# Patient Record
Sex: Female | Born: 1945
Health system: Southern US, Community
[De-identification: ages and names within clinical notes are randomized; demographics above are authoritative.]

## PROBLEM LIST (undated history)

## (undated) DIAGNOSIS — E669 Obesity, unspecified: Secondary | ICD-10-CM

## (undated) DIAGNOSIS — M51369 Other intervertebral disc degeneration, lumbar region without mention of lumbar back pain or lower extremity pain: Secondary | ICD-10-CM

## (undated) DIAGNOSIS — M5136 Other intervertebral disc degeneration, lumbar region: Secondary | ICD-10-CM

## (undated) DIAGNOSIS — E785 Hyperlipidemia, unspecified: Secondary | ICD-10-CM

## (undated) DIAGNOSIS — F32A Depression, unspecified: Secondary | ICD-10-CM

## (undated) DIAGNOSIS — E119 Type 2 diabetes mellitus without complications: Secondary | ICD-10-CM

## (undated) DIAGNOSIS — F329 Major depressive disorder, single episode, unspecified: Secondary | ICD-10-CM

## (undated) HISTORY — DX: Major depressive disorder, single episode, unspecified: F32.9

## (undated) HISTORY — DX: Obesity, unspecified: E66.9

## (undated) HISTORY — PX: CHOLECYSTECTOMY: SHX55

## (undated) HISTORY — PX: OTHER SURGICAL HISTORY: SHX169

## (undated) HISTORY — DX: Other intervertebral disc degeneration, lumbar region without mention of lumbar back pain or lower extremity pain: M51.369

## (undated) HISTORY — PX: BACK SURGERY: SHX140

## (undated) HISTORY — DX: Other intervertebral disc degeneration, lumbar region: M51.36

## (undated) HISTORY — DX: Depression, unspecified: F32.A

## (undated) HISTORY — DX: Hyperlipidemia, unspecified: E78.5

---

## 2008-10-18 ENCOUNTER — Encounter: Admission: RE | Admit: 2008-10-18 | Discharge: 2008-10-18 | Payer: Self-pay

## 2008-10-18 ENCOUNTER — Encounter: Admission: RE | Admit: 2008-10-18 | Discharge: 2008-10-18 | Payer: Self-pay | Admitting: Family Medicine

## 2008-10-29 ENCOUNTER — Encounter: Admission: RE | Admit: 2008-10-29 | Discharge: 2008-10-29 | Payer: Self-pay | Admitting: Family Medicine

## 2009-01-20 ENCOUNTER — Encounter: Admission: RE | Admit: 2009-01-20 | Discharge: 2009-01-20 | Payer: Self-pay | Admitting: Internal Medicine

## 2009-01-24 ENCOUNTER — Encounter: Admission: RE | Admit: 2009-01-24 | Discharge: 2009-01-24 | Payer: Self-pay | Admitting: Internal Medicine

## 2009-01-24 LAB — HM MAMMOGRAPHY: HM Mammogram: NORMAL

## 2009-02-04 LAB — HM DEXA SCAN: HM Dexa Scan: NORMAL

## 2009-11-27 ENCOUNTER — Ambulatory Visit: Payer: Self-pay | Admitting: Family Medicine

## 2009-12-11 ENCOUNTER — Ambulatory Visit: Payer: Self-pay | Admitting: Family Medicine

## 2010-02-01 ENCOUNTER — Encounter: Payer: Self-pay | Admitting: Internal Medicine

## 2010-02-10 ENCOUNTER — Ambulatory Visit
Admission: RE | Admit: 2010-02-10 | Discharge: 2010-02-10 | Payer: Self-pay | Source: Home / Self Care | Attending: Family Medicine | Admitting: Family Medicine

## 2010-04-27 ENCOUNTER — Ambulatory Visit: Payer: Self-pay | Admitting: Family Medicine

## 2010-07-23 ENCOUNTER — Encounter: Payer: Self-pay | Admitting: Family Medicine

## 2012-01-29 ENCOUNTER — Inpatient Hospital Stay (HOSPITAL_COMMUNITY)
Admission: EM | Admit: 2012-01-29 | Discharge: 2012-02-01 | DRG: 372 | Disposition: A | Discharge status: Home / Self Care | Payer: Medicare Other | Attending: Internal Medicine | Admitting: Internal Medicine

## 2012-01-29 ENCOUNTER — Encounter (HOSPITAL_COMMUNITY): Payer: Self-pay | Admitting: Emergency Medicine

## 2012-01-29 ENCOUNTER — Emergency Department (HOSPITAL_COMMUNITY): Payer: Medicare Other

## 2012-01-29 ENCOUNTER — Emergency Department (HOSPITAL_COMMUNITY)
Admission: EM | Admit: 2012-01-29 | Discharge: 2012-01-29 | Disposition: A | Discharge status: Nursing Home (Includes ICF) | Payer: Medicare Other | Source: Home / Self Care | Attending: Emergency Medicine | Admitting: Emergency Medicine

## 2012-01-29 ENCOUNTER — Other Ambulatory Visit (HOSPITAL_COMMUNITY): Payer: Medicare Other

## 2012-01-29 ENCOUNTER — Encounter (HOSPITAL_COMMUNITY): Payer: Self-pay | Admitting: *Deleted

## 2012-01-29 DIAGNOSIS — Z79899 Other long term (current) drug therapy: Secondary | ICD-10-CM

## 2012-01-29 DIAGNOSIS — M51379 Other intervertebral disc degeneration, lumbosacral region without mention of lumbar back pain or lower extremity pain: Secondary | ICD-10-CM | POA: Diagnosis present

## 2012-01-29 DIAGNOSIS — T148XXA Other injury of unspecified body region, initial encounter: Secondary | ICD-10-CM | POA: Diagnosis present

## 2012-01-29 DIAGNOSIS — R188 Other ascites: Secondary | ICD-10-CM

## 2012-01-29 DIAGNOSIS — S301XXA Contusion of abdominal wall, initial encounter: Secondary | ICD-10-CM | POA: Diagnosis present

## 2012-01-29 DIAGNOSIS — Y92009 Unspecified place in unspecified non-institutional (private) residence as the place of occurrence of the external cause: Secondary | ICD-10-CM

## 2012-01-29 DIAGNOSIS — Z9181 History of falling: Secondary | ICD-10-CM

## 2012-01-29 DIAGNOSIS — Z7982 Long term (current) use of aspirin: Secondary | ICD-10-CM

## 2012-01-29 DIAGNOSIS — L089 Local infection of the skin and subcutaneous tissue, unspecified: Secondary | ICD-10-CM | POA: Diagnosis present

## 2012-01-29 DIAGNOSIS — M5137 Other intervertebral disc degeneration, lumbosacral region: Secondary | ICD-10-CM | POA: Diagnosis present

## 2012-01-29 DIAGNOSIS — Z6834 Body mass index (BMI) 34.0-34.9, adult: Secondary | ICD-10-CM

## 2012-01-29 DIAGNOSIS — Z23 Encounter for immunization: Secondary | ICD-10-CM

## 2012-01-29 DIAGNOSIS — E785 Hyperlipidemia, unspecified: Secondary | ICD-10-CM | POA: Diagnosis present

## 2012-01-29 DIAGNOSIS — S3981XA Other specified injuries of abdomen, initial encounter: Secondary | ICD-10-CM

## 2012-01-29 DIAGNOSIS — E669 Obesity, unspecified: Secondary | ICD-10-CM | POA: Diagnosis present

## 2012-01-29 DIAGNOSIS — K651 Peritoneal abscess: Principal | ICD-10-CM | POA: Diagnosis present

## 2012-01-29 DIAGNOSIS — Z9089 Acquired absence of other organs: Secondary | ICD-10-CM

## 2012-01-29 DIAGNOSIS — S3991XA Unspecified injury of abdomen, initial encounter: Secondary | ICD-10-CM

## 2012-01-29 DIAGNOSIS — R109 Unspecified abdominal pain: Secondary | ICD-10-CM

## 2012-01-29 DIAGNOSIS — W19XXXA Unspecified fall, initial encounter: Secondary | ICD-10-CM | POA: Diagnosis present

## 2012-01-29 DIAGNOSIS — E119 Type 2 diabetes mellitus without complications: Secondary | ICD-10-CM | POA: Diagnosis present

## 2012-01-29 HISTORY — DX: Type 2 diabetes mellitus without complications: E11.9

## 2012-01-29 LAB — CBC WITH DIFFERENTIAL/PLATELET
Basophils Absolute: 0 10*3/uL (ref 0.0–0.1)
Eosinophils Relative: 5 % (ref 0–5)
HCT: 34.9 % — ABNORMAL LOW (ref 36.0–46.0)
Hemoglobin: 11.4 g/dL — ABNORMAL LOW (ref 12.0–15.0)
Lymphocytes Relative: 22 % (ref 12–46)
Lymphs Abs: 2 10*3/uL (ref 0.7–4.0)
MCV: 79 fL (ref 78.0–100.0)
Monocytes Absolute: 0.7 10*3/uL (ref 0.1–1.0)
Neutro Abs: 5.8 10*3/uL (ref 1.7–7.7)
RBC: 4.42 MIL/uL (ref 3.87–5.11)
RDW: 14.5 % (ref 11.5–15.5)
WBC: 8.9 10*3/uL (ref 4.0–10.5)

## 2012-01-29 LAB — URINALYSIS, ROUTINE W REFLEX MICROSCOPIC
Bilirubin Urine: NEGATIVE
Hgb urine dipstick: NEGATIVE
Nitrite: NEGATIVE
Protein, ur: NEGATIVE mg/dL
Urobilinogen, UA: 0.2 mg/dL (ref 0.0–1.0)

## 2012-01-29 LAB — COMPREHENSIVE METABOLIC PANEL
ALT: 17 U/L (ref 0–35)
AST: 16 U/L (ref 0–37)
CO2: 22 mEq/L (ref 19–32)
Calcium: 9.3 mg/dL (ref 8.4–10.5)
Chloride: 101 mEq/L (ref 96–112)
Creatinine, Ser: 0.82 mg/dL (ref 0.50–1.10)
GFR calc Af Amer: 83 mL/min — ABNORMAL LOW (ref >90)
GFR calc non Af Amer: 72 mL/min — ABNORMAL LOW (ref >90)
Glucose, Bld: 89 mg/dL (ref 70–99)
Total Bilirubin: 0.1 mg/dL — ABNORMAL LOW (ref 0.3–1.2)

## 2012-01-29 LAB — POCT URINALYSIS DIP (DEVICE)
Bilirubin Urine: NEGATIVE
Glucose, UA: NEGATIVE mg/dL
Ketones, ur: NEGATIVE mg/dL
Leukocytes, UA: NEGATIVE
Specific Gravity, Urine: 1.015 (ref 1.005–1.030)

## 2012-01-29 MED ORDER — SODIUM CHLORIDE 0.9 % IV SOLN
INTRAVENOUS | Status: DC
  Administered 2012-01-29: 14:00:00 via INTRAVENOUS

## 2012-01-29 MED ORDER — ACETAMINOPHEN 650 MG RE SUPP: 650.0000 mg | Freq: Four times a day (QID) | RECTAL | Status: DC | PRN

## 2012-01-29 MED ORDER — ALUM & MAG HYDROXIDE-SIMETH 200-200-20 MG/5ML PO SUSP: 30.0000 mL | Freq: Four times a day (QID) | ORAL | Status: DC | PRN

## 2012-01-29 MED ORDER — ONDANSETRON HCL 4 MG PO TABS: 4.0000 mg | ORAL_TABLET | Freq: Four times a day (QID) | ORAL | Status: DC | PRN

## 2012-01-29 MED ORDER — ONDANSETRON HCL 4 MG/2ML IJ SOLN
4.0000 mg | Freq: Four times a day (QID) | INTRAMUSCULAR | Status: DC | PRN
  Administered 2012-01-30: 4 mg via INTRAVENOUS
  Filled 2012-01-29: qty 2

## 2012-01-29 MED ORDER — IOHEXOL 300 MG/ML  SOLN
100.0000 mL | Freq: Once | INTRAMUSCULAR | Status: AC | PRN
  Administered 2012-01-29: 100 mL via INTRAVENOUS

## 2012-01-29 MED ORDER — ALBUTEROL SULFATE (5 MG/ML) 0.5% IN NEBU: 2.5000 mg | INHALATION_SOLUTION | RESPIRATORY_TRACT | Status: DC | PRN

## 2012-01-29 MED ORDER — OXYCODONE HCL 5 MG PO TABS
5.0000 mg | ORAL_TABLET | ORAL | Status: DC | PRN
  Administered 2012-01-30: 5 mg via ORAL
  Filled 2012-01-29: qty 1

## 2012-01-29 MED ORDER — ACETAMINOPHEN 325 MG PO TABS
650.0000 mg | ORAL_TABLET | Freq: Four times a day (QID) | ORAL | Status: DC | PRN
  Administered 2012-01-30: 650 mg via ORAL
  Filled 2012-01-29 (×2): qty 2

## 2012-01-29 MED ORDER — SODIUM CHLORIDE 0.9 % IV SOLN
INTRAVENOUS | Status: DC
  Administered 2012-01-29: 1000 mL via INTRAVENOUS
  Administered 2012-01-31: 18:00:00 via INTRAVENOUS
  Administered 2012-01-31: 1000 mL via INTRAVENOUS
  Administered 2012-02-01: 06:00:00 via INTRAVENOUS

## 2012-01-29 MED ORDER — IOHEXOL 300 MG/ML  SOLN
50.0000 mL | Freq: Once | INTRAMUSCULAR | Status: AC | PRN
  Administered 2012-01-29: 50 mL via ORAL

## 2012-01-29 MED ORDER — POLYETHYLENE GLYCOL 3350 17 G PO PACK
17.0000 g | PACK | Freq: Every day | ORAL | Status: DC
  Filled 2012-01-29 (×3): qty 1

## 2012-01-29 MED ORDER — MORPHINE SULFATE 2 MG/ML IJ SOLN: 1.0000 mg | INTRAMUSCULAR | Status: DC | PRN

## 2012-01-29 NOTE — Consult Note (Signed)
Reason for Consult:abdominal pain Referring Physician: Dr. Jaynie Crumble is an 67 y.o. female.  HPI: the patient is a 67 year old white female who fell about a month ago. At that time she landed on a baby stroller on her right side. After this incident she was sore all over. As her soreness resolved she remained sore on her right mid abdomen. She denies any nausea or vomiting. Her appetite has been good and her bowels are working normally. She has experienced some chills and low-grade fever. She came to the emergency department and a CT scan revealed what appears to be a resolving hematoma of her right abdominal wall  Past Medical History  Diagnosis Date  . Obesity   . DDD (degenerative disc disease), lumbar   . Hyperlipidemia   . Diabetes mellitus without complication     diet controlled    Past Surgical History  Procedure Date  . Cholecystectomy   . Back surgery   . Colorectal surgery     No family history on file.  Social History:  reports that she has never smoked. She does not have any smokeless tobacco history on file. She reports that she does not drink alcohol or use illicit drugs.  Allergies:  Allergies  Allergen Reactions  . Food Other (See Comments)    Gluten causes extreme intestinal distress     Medications: I have reviewed the patient's current medications.  Results for orders placed during the hospital encounter of 01/29/12 (from the past 48 hour(s))  CBC WITH DIFFERENTIAL     Status: Abnormal   Collection Time   01/29/12  1:38 PM      Component Value Range Comment   WBC 8.9  4.0 - 10.5 K/uL    RBC 4.42  3.87 - 5.11 MIL/uL    Hemoglobin 11.4 (*) 12.0 - 15.0 g/dL    HCT 16.1 (*) 09.6 - 46.0 %    MCV 79.0  78.0 - 100.0 fL    MCH 25.8 (*) 26.0 - 34.0 pg    MCHC 32.7  30.0 - 36.0 g/dL    RDW 04.5  40.9 - 81.1 %    Platelets 413 (*) 150 - 400 K/uL    Neutrophils Relative 65  43 - 77 %    Neutro Abs 5.8  1.7 - 7.7 K/uL    Lymphocytes Relative 22  12 - 46 %     Lymphs Abs 2.0  0.7 - 4.0 K/uL    Monocytes Relative 7  3 - 12 %    Monocytes Absolute 0.7  0.1 - 1.0 K/uL    Eosinophils Relative 5  0 - 5 %    Eosinophils Absolute 0.5  0.0 - 0.7 K/uL    Basophils Relative 0  0 - 1 %    Basophils Absolute 0.0  0.0 - 0.1 K/uL   COMPREHENSIVE METABOLIC PANEL     Status: Abnormal   Collection Time   01/29/12  1:38 PM      Component Value Range Comment   Sodium 138  135 - 145 mEq/L    Potassium 3.5  3.5 - 5.1 mEq/L    Chloride 101  96 - 112 mEq/L    CO2 22  19 - 32 mEq/L    Glucose, Bld 89  70 - 99 mg/dL    BUN 16  6 - 23 mg/dL    Creatinine, Ser 9.14  0.50 - 1.10 mg/dL    Calcium 9.3  8.4 - 78.2 mg/dL  Total Protein 8.0  6.0 - 8.3 g/dL    Albumin 3.4 (*) 3.5 - 5.2 g/dL    AST 16  0 - 37 U/L    ALT 17  0 - 35 U/L    Alkaline Phosphatase 85  39 - 117 U/L    Total Bilirubin 0.1 (*) 0.3 - 1.2 mg/dL    GFR calc non Af Amer 72 (*) >90 mL/min    GFR calc Af Amer 83 (*) >90 mL/min   URINALYSIS, ROUTINE W REFLEX MICROSCOPIC     Status: Abnormal   Collection Time   01/29/12  2:47 PM      Component Value Range Comment   Color, Urine YELLOW  YELLOW    APPearance CLOUDY (*) CLEAR    Specific Gravity, Urine 1.010  1.005 - 1.030    pH 5.5  5.0 - 8.0    Glucose, UA NEGATIVE  NEGATIVE mg/dL    Hgb urine dipstick NEGATIVE  NEGATIVE    Bilirubin Urine NEGATIVE  NEGATIVE    Ketones, ur NEGATIVE  NEGATIVE mg/dL    Protein, ur NEGATIVE  NEGATIVE mg/dL    Urobilinogen, UA 0.2  0.0 - 1.0 mg/dL    Nitrite NEGATIVE  NEGATIVE    Leukocytes, UA NEGATIVE  NEGATIVE MICROSCOPIC NOT DONE ON URINES WITH NEGATIVE PROTEIN, BLOOD, LEUKOCYTES, NITRITE, OR GLUCOSE <1000 mg/dL.    Ct Abdomen Pelvis W Contrast  01/29/2012  *RADIOLOGY REPORT*  Clinical Data: Fall on December 14.  Persistent right mid abdominal pain.  History of "internal abscess in this location."  CT ABDOMEN AND PELVIS WITH CONTRAST  Technique:  Multidetector CT imaging of the abdomen and pelvis was  performed following the standard protocol during bolus administration of intravenous contrast.  Contrast: OMNIPAQUE IOHEXOL 300 MG/ML  SOLN  Comparison: 10/19/2008  Findings: Lung bases:  Mild volume loss at the left lung base. Mild cardiomegaly, without pericardial or pleural effusion.  Abdomen/pelvis:  Normal liver, spleen, stomach.  Transverse duodenal diverticulum through which the common duct traverses.  No biliary ductal dilatation or pancreatic ductal dilatation. Cholecystectomy.  Normal adrenal glands and left kidney.  There is minimal right-sided caliectasis, indeterminate etiology.  Chronic. Favored to be within normal variation.  At the site of resolving fluid collection in 2010, under the anterior aspect right hemidiaphragm extending along the right perihepatic space, is a peripherally enhancing or thick-walled fluid collection which measures 8.6 x 6.9 cm image 39/series 2.  On coronal images, this measures 8.0 cm.  There is mild surrounding edema.  No gas within.  No communication with the adjacent colon.  No retroperitoneal or retrocrural adenopathy.  Normal colon and terminal ileum.  Appendix likely identified on image 60.  No right lower quadrant inflammation.  Normal small bowel.  No pelvic adenopathy.  Tiny focus of air within the nondependent urinary bladder.  Suspect a sub centimeter uterine body fibroid on image 75/series 2. No adnexal mass or significant free fluid.  Bones/Musculoskeletal:  Advanced degenerative disc disease at the lumbosacral junction.  IMPRESSION: 1.  Development of a complex fluid collection in the right upper quadrant, at the site of a resolving collection back 2010.  This is suspicious for recurrent infection versus chronic hematoma. 2.  No other acute process in the abdomen or pelvis. 3.  Air within the urinary bladder, possibly iatrogenic. Clinically correlate.   Original Report Authenticated By: Jeronimo Greaves, M.D.     Review of Systems  Constitutional: Positive  for fever and chills.  HENT:  Negative.   Eyes: Negative.   Respiratory: Negative.   Cardiovascular: Negative.   Gastrointestinal: Positive for abdominal pain.  Genitourinary: Negative.   Musculoskeletal: Negative.   Skin: Negative.   Neurological: Negative.   Endo/Heme/Allergies: Negative.   Psychiatric/Behavioral: Negative.    Blood pressure 121/56, pulse 96, temperature 98.5 F (36.9 C), temperature source Oral, resp. rate 16, height 5\' 3"  (1.6 m), weight 194 lb (87.998 kg), SpO2 100.00%. Physical Exam  Constitutional: She is oriented to person, place, and time. She appears well-developed and well-nourished.  HENT:  Head: Normocephalic and atraumatic.  Eyes: Conjunctivae normal and EOM are normal. Pupils are equal, round, and reactive to light.  Neck: Normal range of motion. Neck supple.  Cardiovascular: Normal rate, regular rhythm and normal heart sounds.   Respiratory: Effort normal and breath sounds normal.  GI: Soft. Bowel sounds are normal.       There is mild tenderness and a palpable mass in the right midabdomen. No guarding or peritonitis. The rest of the abdomen is soft.  Musculoskeletal: Normal range of motion.  Neurological: She is alert and oriented to person, place, and time.  Skin: Skin is warm and dry.  Psychiatric: She has a normal mood and affect. Her behavior is normal.    Assessment/Plan: The patient has a history of a fall one month ago where she probably sustained some bleeding into her right abdominal wall. A CT scan shows a fluid collection at this location which is probably old liquefied hematoma. I think this could be percutaneously aspirated via interventional radiology and cultured. There is no indication for surgery at this time.  TOTH III,Jamil Armwood S 01/29/2012, 7:24 PM

## 2012-01-29 NOTE — ED Notes (Signed)
Pt states she has hx of internal abscess in the same location as where she is having pain in her right mid abdomen. Pt states abscess was not removed, she was on abx for 4 months for the abscess.

## 2012-01-29 NOTE — H&P (Addendum)
PATIENT DETAILS Name: Stephanie Coleman Age: 67 y.o. Sex: female Date of Birth: 1945/03/20 Admit Date: 01/29/2012 PCP:No primary provider on file.   CHIEF COMPLAINT:  Persistent right upper quadrant pain since a fall on 12/25/11  HPI: Patient is a 67 year old Caucasian female with a history of diet-controlled diabetes, who unfortunately sustained a fall on 12/25/11 while walking in the dark and missed a step. She fell forward and fell on her face, she sustained numerous bruising in her bilateral shoulder area, right knee, left thumb area. She then started having aches and pains all over her body from this. She did not seek any medical attention, and applied hot and cold packs and took over-the-counter medications for pain. Her pain in the other areas completely went away, however she then started to have persisting pain in the right upper quadrant area. She initially tried to manage this with NSAIDs and hot and cold packs. However for the past 10 days or so, pain has been unrelenting and 10/10 at times. She at times claims to felt slightly feverish, but there is no documented history of fever. She has been persistently afebrile here in the emergency room. Because of persistent pain, she presented to the urgent care and was then transferred to the ED for further evaluation. A CT scan of the abdomen done here in the emergency room shows a complex fluid collection in the right upper quadrant. She in 2010had intra-abdominal abscess his, that was treated with numerous antibiotics for 4 months by Dr. Donell Beers. Since a CT scan of the abdomen was read as either a chronic hematoma or recurrent infection, the ED physician spoke with surgery on call who subsequently recommended medically admission and interventional radiology consult for drainage of the hematoma/abscess. - She denies any headache, shortness of breath, chest pain, nausea vomiting or diarrhea. She denies any dysuria.   ALLERGIES:   Allergies  Allergen  Reactions  . Food Other (See Comments)    Gluten causes extreme intestinal distress     PAST MEDICAL HISTORY: Past Medical History  Diagnosis Date  . Obesity   . DDD (degenerative disc disease), lumbar   . Hyperlipidemia   . Diabetes mellitus without complication     diet controlled    PAST SURGICAL HISTORY: Past Surgical History  Procedure Date  . Cholecystectomy   . Back surgery   . Colorectal surgery     MEDICATIONS AT HOME: Prior to Admission medications   Medication Sig Start Date End Date Taking? Authorizing Provider  Aspirin-Caffeine (BAYER BACK & BODY PAIN EX ST) 500-32.5 MG TABS Take 2 tablets by mouth 2 (two) times daily as needed. For pain   Yes Historical Provider, MD  estradiol (ESTRACE) 0.5 MG tablet Take 0.5 mg by mouth daily.   Yes Historical Provider, MD  medroxyPROGESTERone (PROVERA) 2.5 MG tablet Take 2.5 mg by mouth daily.   Yes Historical Provider, MD    FAMILY HISTORY: No family history on file.  SOCIAL HISTORY:  reports that she has never smoked. She does not have any smokeless tobacco history on file. She reports that she does not drink alcohol or use illicit drugs.  REVIEW OF SYSTEMS:  Constitutional:   No  weight loss, night sweats,  Fevers, chills, fatigue.  HEENT:    No headaches, Difficulty swallowing,Tooth/dental problems,Sore throat,  No sneezing, itching, ear ache, nasal congestion, post nasal drip,   Cardio-vascular: No chest pain,  Orthopnea, PND, swelling in lower extremities, anasarca, dizziness, palpitations  GI:  No heartburn, indigestion, nausea,  vomiting, diarrhea, change in  bowel habits, loss of appetite  Resp: No shortness of breath with exertion or at rest.  No excess mucus, no productive cough, No non-productive cough,  No coughing up of blood.No change in color of mucus.No wheezing.No chest wall deformity  Skin:  no rash or lesions.  GU:  no dysuria, change in color of urine, no urgency or frequency.  No flank  pain.  Musculoskeletal: No joint pain or swelling.  No decreased range of motion.  No back pain.  Psych: No change in mood or affect. No depression or anxiety.  No memory loss.   PHYSICAL EXAM: Blood pressure 121/56, pulse 96, temperature 98.5 F (36.9 C), temperature source Oral, resp. rate 16, height 5\' 3"  (1.6 m), weight 87.998 kg (194 lb), SpO2 100.00%.  General appearance :Awake, alert, not in any distress. Speech Clear. Not toxic Looking HEENT: Atraumatic and Normocephalic, pupils equally reactive to light and accomodation Neck: supple, no JVD. No cervical lymphadenopathy.  Chest:Good air entry bilaterally, no added sounds  CVS: S1 S2 regular, no murmurs.  Abdomen: Bowel sounds present, moderate to severe tenderness in the right upper quadrant area, mild voluntary guarding. In the right upper quadrant area a palpable tender but firm mass is felt approximately-approximately 6 X7 cm in size. Extremities: B/L Lower Ext shows no edema, both legs are warm to touch Neurology: Awake alert, and oriented X 3, CN II-XII intact, Non focal Skin:No Rash Wounds:N/A  LABS ON ADMISSION:   Basename 01/29/12 1338  NA 138  K 3.5  CL 101  CO2 22  GLUCOSE 89  BUN 16  CREATININE 0.82  CALCIUM 9.3  MG --  PHOS --    Basename 01/29/12 1338  AST 16  ALT 17  ALKPHOS 85  BILITOT 0.1*  PROT 8.0  ALBUMIN 3.4*   No results found for this basename: LIPASE:2,AMYLASE:2 in the last 72 hours  Basename 01/29/12 1338  WBC 8.9  NEUTROABS 5.8  HGB 11.4*  HCT 34.9*  MCV 79.0  PLT 413*   No results found for this basename: CKTOTAL:3,CKMB:3,CKMBINDEX:3,TROPONINI:3 in the last 72 hours No results found for this basename: DDIMER:2 in the last 72 hours No components found with this basename: POCBNP:3   RADIOLOGIC STUDIES ON ADMISSION: Ct Abdomen Pelvis W Contrast  01/29/2012  *RADIOLOGY REPORT*  Clinical Data: Fall on December 14.  Persistent right mid abdominal pain.  History of "internal  abscess in this location."  CT ABDOMEN AND PELVIS WITH CONTRAST  Technique:  Multidetector CT imaging of the abdomen and pelvis was performed following the standard protocol during bolus administration of intravenous contrast.  Contrast: OMNIPAQUE IOHEXOL 300 MG/ML  SOLN  Comparison: 10/19/2008  Findings: Lung bases:  Mild volume loss at the left lung base. Mild cardiomegaly, without pericardial or pleural effusion.  Abdomen/pelvis:  Normal liver, spleen, stomach.  Transverse duodenal diverticulum through which the common duct traverses.  No biliary ductal dilatation or pancreatic ductal dilatation. Cholecystectomy.  Normal adrenal glands and left kidney.  There is minimal right-sided caliectasis, indeterminate etiology.  Chronic. Favored to be within normal variation.  At the site of resolving fluid collection in 2010, under the anterior aspect right hemidiaphragm extending along the right perihepatic space, is a peripherally enhancing or thick-walled fluid collection which measures 8.6 x 6.9 cm image 39/series 2.  On coronal images, this measures 8.0 cm.  There is mild surrounding edema.  No gas within.  No communication with the adjacent colon.  No retroperitoneal or  retrocrural adenopathy.  Normal colon and terminal ileum.  Appendix likely identified on image 60.  No right lower quadrant inflammation.  Normal small bowel.  No pelvic adenopathy.  Tiny focus of air within the nondependent urinary bladder.  Suspect a sub centimeter uterine body fibroid on image 75/series 2. No adnexal mass or significant free fluid.  Bones/Musculoskeletal:  Advanced degenerative disc disease at the lumbosacral junction.  IMPRESSION: 1.  Development of a complex fluid collection in the right upper quadrant, at the site of a resolving collection back 2010.  This is suspicious for recurrent infection versus chronic hematoma. 2.  No other acute process in the abdomen or pelvis. 3.  Air within the urinary bladder, possibly  iatrogenic. Clinically correlate.   Original Report Authenticated By: Jeronimo Greaves, M.D.     ASSESSMENT AND PLAN: Present on Admission:  . Intra-abdominal fluid collection - Given  that her symptoms started after she fell, given the lack of fever or leukocytosis-it is highly likely that this is more of a hematoma than an abscess.  - Since she is afebrile, not toxic looking, and this has been going on for a few weeks, we will not place her on any antibiotics for now, would obtain interventional radiology consultation for drainage.  - We will start antibiotics if she becomes febrile or develops hemodynamic instability. - Spoke with Dr. Carolynne Edouard who will officially consult as well, he did not feel that patient needed admission to the surgical/trauma service.  - Unfortunately the patient does not have a primary care practitioner to follow up with, she also is in significant right upper quadrant pain that will require inpatient pain management with narcotics.  Further plan will depend as patient's clinical course evolves and further radiologic and laboratory data become available. Patient will be monitored closely.  DVT Prophylaxis: - SCDs given possible intra-abdominal hematoma   Code Status: full code   Total time spent for admission equals 45 minutes.  [facility] Pager [phone]  If 7PM-7AM, please contact night-coverage [email or web address] Password [facility] 01/29/2012, 6:26 PM

## 2012-01-29 NOTE — ED Provider Notes (Signed)
Chief Complaint  Patient presents with  . Fall    fell on the 14 face down. right side flank pain.     History of Present Illness:   Stephanie Coleman is a 67 year old female who fell on December 14th while walking down a street in a [place] neighborhood. She tripped, falling forward and landing on the pavement. She hit her head, but there was no loss of consciousness. She injured her left thumb, right elbow, and right lower rib cage and upper abdomen. She had a bruise on her face. All of her injuries have improved with the exception of the pain in the lower rib cage and right upper abdomen. This still hurts with movement and with deep inspiration. She's also just not felt well since then with anorexia, chills, and sweats. She denies any fever, nausea, vomiting, or blood in the stool. She has had a few loose stools. She denies any urinary symptoms. She's had no shortness of breath or hemoptysis. She denies any headache, neck pain, back pain, or extremity pain. The patient states she had an intra-abdominal abscess in September 2010 in the same area and this was treated with antibiotics. She states it feels about the same as it did when she had the abscess.  Review of Systems:  Other than noted above, the patient denies any of the following symptoms: Systemic:  No fevers or chills. Eye:  No diplopia or blurred vision. ENT:  No headache, facial pain, or bleeding from the nose or ears.  No loose or broken teeth. Neck:  No neck pain or stiffnes. Resp:  No shortness of breath. Cardiac:  No chest pain. No palpitations, dizziness, syncope or fainting. GI:  No abdominal pain. No nausea, vomiting, or diarrhea. GU:  No blood in urine. M-S:  No extremity pain, swelling, bruising, limited ROM, neck or back pain. Neuro:  No headache, loss of consciousness, seizure activity, dizziness, vertigo, paresthesias, numbness, or weakness.  No difficulty with speech or ambulation.   PMFSH:  Past medical history, family history,  social history, meds, and allergies were reviewed.  Physical Exam:   Vital signs:  BP 125/79  Pulse 95  Temp 98.8 F (37.1 C) (Oral)  Resp 20  SpO2 99% General:  Alert, oriented and in no distress. Eye:  PERRL, full EOMs. ENT:  No cranial or facial tenderness to palpation. Neck:  No tenderness to palpation.  Full ROM without pain. Heart:  Regular rhythm.  No extrasystoles, gallops, or murmers. Lungs:  There is chest wall tenderness to palpation over the right lower, anterolateral chest area, no swelling, bruising, or deformity. Breath sounds clear and equal bilaterally.  No wheezes, rales or rhonchi. Abdomen:  There is marked at tenderness with guarding and rebound in the right upper quadrant of the abdomen with a positive Murphy sign and Murphy's punch. No organomegaly or mass. Bowel sounds normally active. Back:  Non tender to palpation.  Full ROM without pain. Extremities:  No tenderness, swelling, bruising or deformity.  Full ROM of all joints without pain.  Pulses full.  Brisk capillary refill. Neuro:  Alert and oriented times 3.  Cranial nerves intact.  No muscle weakness.  Sensation intact to light touch.  Gait normal. Skin:  No bruising, abrasions, or lacerations.  Assessment:  The encounter diagnosis was Abdominal trauma.  She has had a significant abdominal trauma with ongoing symptoms which are getting worse and tenderness with guarding in the right upper quadrant suggesting injury to her liver. She may also had  some injury to her rib cage area as well. The other concern is a possibility of the recurrence of her intra-abdominal abscess.  Plan:   1.  The following meds were prescribed:   New Prescriptions   No medications on file   2.  The patient was transferred to the emergency department via shuttle in stable condition.   Reuben Likes, MD 01/29/12 [phone]

## 2012-01-29 NOTE — ED Notes (Signed)
Patient had a fall 12-14,  She states she is healing up with exception of right mid side/abdominal pain.  She denies any n/v/d.  Reports normal bm.  Patient was seen at [facility] and sent to ED for further eval.  Patient did not seek medical treatment post fall

## 2012-01-29 NOTE — ED Notes (Signed)
Pt c/o fall on the 14th. Now having right sided flank pain. Hx of internal abscess in the same area. Constant pain with varying degrees.   Pt states having one incident of dark/?almost black urine. Shortly after fall.   Decrease in appetite. Night sweats.   Pt has taking aspirin and heat with mild relief.

## 2012-01-29 NOTE — ED Provider Notes (Signed)
History     CSN: [id number]  Arrival date & time 01/29/12  1309   First MD Initiated Contact with Patient 01/29/12 1338      Chief Complaint  Patient presents with  . Abdominal Pain    (Consider location/radiation/quality/duration/timing/severity/associated sxs/prior treatment) The history is provided by the patient.   Patient here complaining of right-sided abdominal pain since December 14 after a fall. Went to urgent care Center and sent here for evaluation of possible liver injury. Denies any urinary symptoms at this time. Pain is localized to her right lower costal margin and worse with deep breathing. Pain characterized as sharp. Denies any syncope or near-syncope. No radicular symptoms down her legs. No medications taken prior to arrival. Past Medical History  Diagnosis Date  . Obesity   . DDD (degenerative disc disease), lumbar   . Hyperlipidemia     Past Surgical History  Procedure Date  . Cholecystectomy   . Back surgery   . Colorectal surgery     No family history on file.  History  Substance Use Topics  . Smoking status: Never Smoker   . Smokeless tobacco: Not on file  . Alcohol Use: No    OB History    Grav Para Term Preterm Abortions TAB SAB Ect Mult Living                  Review of Systems  All other systems reviewed and are negative.    Allergies  Review of patient's allergies indicates no known allergies.  Home Medications   Current Outpatient Rx  Name  Route  Sig  Dispense  Refill  . METFORMIN HCL ER (MOD) 500 MG PO TB24   Oral   Take 500 mg by mouth daily with breakfast.             BP 137/81  Pulse 92  Temp 98 F (36.7 C) (Oral)  Resp 20  Ht 5\' 3"  (1.6 m)  Wt 194 lb (87.998 kg)  BMI 34.37 kg/m2  SpO2 100%  Physical Exam  Nursing note and vitals reviewed. Constitutional: She is oriented to person, place, and time. She appears well-developed and well-nourished.  Non-toxic appearance. No distress.  HENT:  Head:  Normocephalic and atraumatic.  Eyes: Conjunctivae normal, EOM and lids are normal. Pupils are equal, round, and reactive to light.  Neck: Normal range of motion. Neck supple. No tracheal deviation present. No mass present.  Cardiovascular: Normal rate, regular rhythm and normal heart sounds.  Exam reveals no gallop.   No murmur heard. Pulmonary/Chest: Effort normal and breath sounds normal. No stridor. No respiratory distress. She has no decreased breath sounds. She has no wheezes. She has no rhonchi. She has no rales.  Abdominal: Soft. Normal appearance and bowel sounds are normal. She exhibits no distension. There is tenderness in the right upper quadrant. There is guarding. There is no rigidity, no rebound and no CVA tenderness.    Musculoskeletal: Normal range of motion. She exhibits no edema and no tenderness.  Neurological: She is alert and oriented to person, place, and time. She has normal strength. No cranial nerve deficit or sensory deficit. GCS eye subscore is 4. GCS verbal subscore is 5. GCS motor subscore is 6.  Skin: Skin is warm and dry. No abrasion and no rash noted.  Psychiatric: She has a normal mood and affect. Her speech is normal and behavior is normal.    ED Course  Procedures (including critical care time)   Labs Reviewed  CBC WITH DIFFERENTIAL  COMPREHENSIVE METABOLIC PANEL   No results found.   No diagnosis found.    MDM  Patient with CT results as noted. Spoke with general surgeon on call he recommends the patient be admitted to the hospitalist service. I spoke with him and patient to be admitted        Toy Baker, MD 01/29/12 1759

## 2012-01-30 ENCOUNTER — Inpatient Hospital Stay (HOSPITAL_COMMUNITY): Payer: Medicare Other

## 2012-01-30 DIAGNOSIS — K651 Peritoneal abscess: Principal | ICD-10-CM

## 2012-01-30 LAB — COMPREHENSIVE METABOLIC PANEL
Alkaline Phosphatase: 71 U/L (ref 39–117)
BUN: 11 mg/dL (ref 6–23)
CO2: 21 mEq/L (ref 19–32)
GFR calc Af Amer: >90 mL/min (ref >90)
GFR calc non Af Amer: 84 mL/min — ABNORMAL LOW (ref >90)
Glucose, Bld: 109 mg/dL — ABNORMAL HIGH (ref 70–99)
Potassium: 3.3 mEq/L — ABNORMAL LOW (ref 3.5–5.1)
Total Protein: 6.9 g/dL (ref 6.0–8.3)

## 2012-01-30 LAB — CBC
HCT: 31.8 % — ABNORMAL LOW (ref 36.0–46.0)
Hemoglobin: 10.4 g/dL — ABNORMAL LOW (ref 12.0–15.0)
MCH: 25.6 pg — ABNORMAL LOW (ref 26.0–34.0)
MCHC: 32.7 g/dL (ref 30.0–36.0)
RBC: 4.07 MIL/uL (ref 3.87–5.11)

## 2012-01-30 LAB — PROTIME-INR: Prothrombin Time: 15.3 seconds — ABNORMAL HIGH (ref 11.6–15.2)

## 2012-01-30 MED ORDER — FENTANYL CITRATE 0.05 MG/ML IJ SOLN
INTRAMUSCULAR | Status: AC
  Filled 2012-01-30: qty 4

## 2012-01-30 MED ORDER — POTASSIUM CHLORIDE CRYS ER 20 MEQ PO TBCR: 20.0000 meq | EXTENDED_RELEASE_TABLET | Freq: Once | ORAL | Status: AC

## 2012-01-30 MED ORDER — POTASSIUM CHLORIDE 20 MEQ PO PACK: 20.0000 meq | PACK | Freq: Once | ORAL | Status: DC

## 2012-01-30 MED ORDER — METRONIDAZOLE IN NACL 5-0.79 MG/ML-% IV SOLN
500.0000 mg | Freq: Three times a day (TID) | INTRAVENOUS | Status: DC
  Administered 2012-01-30 – 2012-01-31 (×3): 500 mg via INTRAVENOUS
  Filled 2012-01-30 (×5): qty 100

## 2012-01-30 MED ORDER — FENTANYL CITRATE 0.05 MG/ML IJ SOLN
INTRAMUSCULAR | Status: AC | PRN
  Administered 2012-01-30: 50 ug via INTRAVENOUS

## 2012-01-30 MED ORDER — POTASSIUM CHLORIDE CRYS ER 20 MEQ PO TBCR
EXTENDED_RELEASE_TABLET | ORAL | Status: AC
  Administered 2012-01-30: 40 meq
  Filled 2012-01-30: qty 2

## 2012-01-30 MED ORDER — INFLUENZA VIRUS VACC SPLIT PF IM SUSP
0.5000 mL | INTRAMUSCULAR | Status: AC
  Administered 2012-01-30: 0.5 mL via INTRAMUSCULAR
  Filled 2012-01-30: qty 0.5

## 2012-01-30 MED ORDER — CIPROFLOXACIN IN D5W 400 MG/200ML IV SOLN
400.0000 mg | Freq: Two times a day (BID) | INTRAVENOUS | Status: DC
  Administered 2012-01-30 (×2): 400 mg via INTRAVENOUS
  Filled 2012-01-30 (×3): qty 200

## 2012-01-30 MED ORDER — MIDAZOLAM HCL 2 MG/2ML IJ SOLN
INTRAMUSCULAR | Status: AC | PRN
  Administered 2012-01-30: 1 mg via INTRAVENOUS

## 2012-01-30 MED ORDER — MIDAZOLAM HCL 2 MG/2ML IJ SOLN
INTRAMUSCULAR | Status: AC
  Filled 2012-01-30: qty 4

## 2012-01-30 NOTE — Procedures (Signed)
Interventional Radiology Procedure Note  Procedure: CT guided aspiration of right deep abdominal wall fluid collection revealed frankly purulent opaque fluid so a 2F drain was placed.  220 mL purulent, mildly bloody fluid aspirated and cavity lavaged with 20 mL saline.  Sample sent for culture. Complications: None Recommendations: - Maintain tube to gravity drainage and flush at least once per shift - When tube output (excluding flushes) is < 10-55mL / 24 hrs, consider repeat CT scan prior to drain removal. - If pt is DC'd with drain, please put in for out-pt CT scan and IR clinic visit at [facility] in 7-10 days and we will be happy to manage the tube. - Follow Cx  Signed,  Sterling Big, MD Vascular & Interventional Radiologist [facility] Radiology

## 2012-01-30 NOTE — Progress Notes (Signed)
[facility] PROGRESS NOTE  Analyssa Coleman ZOX:[id number] DOB: 07/25/1945 DOA: 01/29/2012 PCP: No primary provider on file.  ASSESSMENT AND PLAN:  Present on Admission:   . Intra-abdominal fluid collection - abscess - Interventional radiology was consulted this morning and patient was taken down for a CT guided aspiration of the right abdominal wall fluid collection. The aspiration revealed frankly purulent opaque fluid ans a 100F drain was placed. Sample was sent for microbiology. - Given these findings, I started patient on ciprofloxacin and metronidazole for intra-abdominal infection Coverage. Will initiate medications IV, and then will followup based on sensitivities if we can do by mouth medications - We'll continue to monitor clinical status, as well as her white count  DVT Prophylaxis:  - SCDs and early ambulation for today. We'll switch to Lovenox for prophylaxis tomorrow.   Code Status: Full code Family Communication: None (indicate person spoken with, relationship, and if by phone, the number) Disposition Plan: Home in 1-2 days once we have microbiology data and clinical improvement.   Consultants:  Surgery  Interventional radiology  Procedures:  IR guided abscess drainage  Antibiotics:  Ciprofloxacin 01/30/2012  Metronidazole 01/30/2012   HPI/Subjective: Other than the right upper quadrant pain she has no complaints  Objective: Filed Vitals:   01/30/12 0955 01/30/12 1000 01/30/12 1005 01/30/12 1009  BP: 134/65 135/64 101/85 101/85  Pulse: 96 100 100 101  Temp:      TempSrc:      Resp: 20 10 15    Height:      Weight:      SpO2: 97% 98% 99% 95%    Intake/Output Summary (Last 24 hours) at 01/30/12 1049 Last data filed at 01/30/12 1004  Gross per 24 hour  Intake      0 ml  Output    220 ml  Net   -220 ml   Filed Weights   01/29/12 1319  Weight: 87.998 kg (194 lb)    Exam:   General:  NAD  Cardiovascular: RRR, no MRG  Respiratory: CTA  biL  Abdomen: soft, mild tenderness to palpation RUQ, palpable collection  Data Reviewed: Basic Metabolic Panel:  Lab 01/30/12 8119 01/29/12 1338  NA 137 138  K 3.3* 3.5  CL 104 101  CO2 21 22  GLUCOSE 109* 89  BUN 11 16  CREATININE 0.79 0.82  CALCIUM 8.6 9.3  MG -- --  PHOS -- --   Liver Function Tests:  Lab 01/30/12 0520 01/29/12 1338  AST 15 16  ALT 14 17  ALKPHOS 71 85  BILITOT 0.3 0.1*  PROT 6.9 8.0  ALBUMIN 2.9* 3.4*   CBC:  Lab 01/30/12 0520 01/29/12 1338  WBC 7.4 8.9  NEUTROABS -- 5.8  HGB 10.4* 11.4*  HCT 31.8* 34.9*  MCV 78.1 79.0  PLT 372 413*   CBG:  Lab 01/30/12 0901  GLUCAP 120*   Studies: Ct Abdomen Pelvis W Contrast  01/29/2012  *RADIOLOGY REPORT*  Clinical Data: Fall on December 14.  Persistent right mid abdominal pain.  History of "internal abscess in this location."  CT ABDOMEN AND PELVIS WITH CONTRAST  Technique:  Multidetector CT imaging of the abdomen and pelvis was performed following the standard protocol during bolus administration of intravenous contrast.  Contrast: OMNIPAQUE IOHEXOL 300 MG/ML  SOLN  Comparison: 10/19/2008  Findings: Lung bases:  Mild volume loss at the left lung base. Mild cardiomegaly, without pericardial or pleural effusion.  Abdomen/pelvis:  Normal liver, spleen, stomach.  Transverse duodenal diverticulum through which the  common duct traverses.  No biliary ductal dilatation or pancreatic ductal dilatation. Cholecystectomy.  Normal adrenal glands and left kidney.  There is minimal right-sided caliectasis, indeterminate etiology.  Chronic. Favored to be within normal variation.  At the site of resolving fluid collection in 2010, under the anterior aspect right hemidiaphragm extending along the right perihepatic space, is a peripherally enhancing or thick-walled fluid collection which measures 8.6 x 6.9 cm image 39/series 2.  On coronal images, this measures 8.0 cm.  There is mild surrounding edema.  No gas within.  No  communication with the adjacent colon.  No retroperitoneal or retrocrural adenopathy.  Normal colon and terminal ileum.  Appendix likely identified on image 60.  No right lower quadrant inflammation.  Normal small bowel.  No pelvic adenopathy.  Tiny focus of air within the nondependent urinary bladder.  Suspect a sub centimeter uterine body fibroid on image 75/series 2. No adnexal mass or significant free fluid.  Bones/Musculoskeletal:  Advanced degenerative disc disease at the lumbosacral junction.  IMPRESSION: 1.  Development of a complex fluid collection in the right upper quadrant, at the site of a resolving collection back 2010.  This is suspicious for recurrent infection versus chronic hematoma. 2.  No other acute process in the abdomen or pelvis. 3.  Air within the urinary bladder, possibly iatrogenic. Clinically correlate.   Original Report Authenticated By: Jeronimo Greaves, M.D.     Scheduled Meds:   . ciprofloxacin  400 mg Intravenous Q12H  . fentaNYL      . metronidazole  500 mg Intravenous Q8H  . midazolam      . polyethylene glycol  17 g Oral Daily   Continuous Infusions:   . sodium chloride 1,000 mL (01/29/12 2300)    Principal Problem:  *Intra-abdominal abscess Active Problems:  Intra-abdominal fluid collection   Stephanie Coleman  [facility] Pager [phone]. If 8PM-8AM, please contact night-coverage at [email or web address], password [facility] 01/30/2012, 10:49 AM  LOS: 1 day

## 2012-01-30 NOTE — Progress Notes (Signed)
Agree with PA note.  Will aspirate the collection and place a drain if it appeears infected.  Signed,  Sterling Big, MD Vascular & Interventional Radiologist [facility] Radiology

## 2012-01-30 NOTE — Progress Notes (Signed)
Patient ID: Stephanie Coleman, female   DOB: 03-31-1945, 67 y.o.   MRN: [id number] Request received for CT guided aspiration/possible drainage of a recurrent right lateral abdominal fluid collection (hematoma vs abscess) on pt.  Imaging studies were reviewed by Dr. Archer Asa. Additional PMH as below. Exam: pt awake/alert; chest- CTA bilat; heart- tachy, but regular; abd- soft, obese,+BS, tender rt lat abd region.    Filed Vitals:   01/29/12 1800 01/29/12 2102 01/29/12 2223 01/30/12 0608  BP: 121/56 134/63 130/57 137/67  Pulse: 96  96 99  Temp:   97.5 F (36.4 C) 98.5 F (36.9 C)  TempSrc:   Oral Oral  Resp:   16 16  Height:      Weight:      SpO2: 100% 99% 99% 96%   Past Medical History  Diagnosis Date  . Obesity   . DDD (degenerative disc disease), lumbar   . Hyperlipidemia   . Diabetes mellitus without complication     diet controlled   Past Surgical History  Procedure Date  . Cholecystectomy   . Back surgery   . Colorectal surgery    Ct Abdomen Pelvis W Contrast  01/29/2012  *RADIOLOGY REPORT*  Clinical Data: Fall on December 14.  Persistent right mid abdominal pain.  History of "internal abscess in this location."  CT ABDOMEN AND PELVIS WITH CONTRAST  Technique:  Multidetector CT imaging of the abdomen and pelvis was performed following the standard protocol during bolus administration of intravenous contrast.  Contrast: OMNIPAQUE IOHEXOL 300 MG/ML  SOLN  Comparison: 10/19/2008  Findings: Lung bases:  Mild volume loss at the left lung base. Mild cardiomegaly, without pericardial or pleural effusion.  Abdomen/pelvis:  Normal liver, spleen, stomach.  Transverse duodenal diverticulum through which the common duct traverses.  No biliary ductal dilatation or pancreatic ductal dilatation. Cholecystectomy.  Normal adrenal glands and left kidney.  There is minimal right-sided caliectasis, indeterminate etiology.  Chronic. Favored to be within normal variation.  At the site of resolving fluid  collection in 2010, under the anterior aspect right hemidiaphragm extending along the right perihepatic space, is a peripherally enhancing or thick-walled fluid collection which measures 8.6 x 6.9 cm image 39/series 2.  On coronal images, this measures 8.0 cm.  There is mild surrounding edema.  No gas within.  No communication with the adjacent colon.  No retroperitoneal or retrocrural adenopathy.  Normal colon and terminal ileum.  Appendix likely identified on image 60.  No right lower quadrant inflammation.  Normal small bowel.  No pelvic adenopathy.  Tiny focus of air within the nondependent urinary bladder.  Suspect a sub centimeter uterine body fibroid on image 75/series 2. No adnexal mass or significant free fluid.  Bones/Musculoskeletal:  Advanced degenerative disc disease at the lumbosacral junction.  IMPRESSION: 1.  Development of a complex fluid collection in the right upper quadrant, at the site of a resolving collection back 2010.  This is suspicious for recurrent infection versus chronic hematoma. 2.  No other acute process in the abdomen or pelvis. 3.  Air within the urinary bladder, possibly iatrogenic. Clinically correlate.   Original Report Authenticated By: Jeronimo Greaves, M.D.   Results for orders placed during the hospital encounter of 01/29/12  CBC WITH DIFFERENTIAL      Component Value Range   WBC 8.9  4.0 - 10.5 K/uL   RBC 4.42  3.87 - 5.11 MIL/uL   Hemoglobin 11.4 (*) 12.0 - 15.0 g/dL   HCT 65.7 (*) 84.6 - 96.2 %  MCV 79.0  78.0 - 100.0 fL   MCH 25.8 (*) 26.0 - 34.0 pg   MCHC 32.7  30.0 - 36.0 g/dL   RDW 40.9  81.1 - 91.4 %   Platelets 413 (*) 150 - 400 K/uL   Neutrophils Relative 65  43 - 77 %   Neutro Abs 5.8  1.7 - 7.7 K/uL   Lymphocytes Relative 22  12 - 46 %   Lymphs Abs 2.0  0.7 - 4.0 K/uL   Monocytes Relative 7  3 - 12 %   Monocytes Absolute 0.7  0.1 - 1.0 K/uL   Eosinophils Relative 5  0 - 5 %   Eosinophils Absolute 0.5  0.0 - 0.7 K/uL   Basophils Relative 0  0 - 1  %   Basophils Absolute 0.0  0.0 - 0.1 K/uL  COMPREHENSIVE METABOLIC PANEL      Component Value Range   Sodium 138  135 - 145 mEq/L   Potassium 3.5  3.5 - 5.1 mEq/L   Chloride 101  96 - 112 mEq/L   CO2 22  19 - 32 mEq/L   Glucose, Bld 89  70 - 99 mg/dL   BUN 16  6 - 23 mg/dL   Creatinine, Ser 7.82  0.50 - 1.10 mg/dL   Calcium 9.3  8.4 - 95.6 mg/dL   Total Protein 8.0  6.0 - 8.3 g/dL   Albumin 3.4 (*) 3.5 - 5.2 g/dL   AST 16  0 - 37 U/L   ALT 17  0 - 35 U/L   Alkaline Phosphatase 85  39 - 117 U/L   Total Bilirubin 0.1 (*) 0.3 - 1.2 mg/dL   GFR calc non Af Amer 72 (*) >90 mL/min   GFR calc Af Amer 83 (*) >90 mL/min  URINALYSIS, ROUTINE W REFLEX MICROSCOPIC      Component Value Range   Color, Urine YELLOW  YELLOW   APPearance CLOUDY (*) CLEAR   Specific Gravity, Urine 1.010  1.005 - 1.030   pH 5.5  5.0 - 8.0   Glucose, UA NEGATIVE  NEGATIVE mg/dL   Hgb urine dipstick NEGATIVE  NEGATIVE   Bilirubin Urine NEGATIVE  NEGATIVE   Ketones, ur NEGATIVE  NEGATIVE mg/dL   Protein, ur NEGATIVE  NEGATIVE mg/dL   Urobilinogen, UA 0.2  0.0 - 1.0 mg/dL   Nitrite NEGATIVE  NEGATIVE   Leukocytes, UA NEGATIVE  NEGATIVE  CBC      Component Value Range   WBC 7.4  4.0 - 10.5 K/uL   RBC 4.07  3.87 - 5.11 MIL/uL   Hemoglobin 10.4 (*) 12.0 - 15.0 g/dL   HCT 21.3 (*) 08.6 - 57.8 %   MCV 78.1  78.0 - 100.0 fL   MCH 25.6 (*) 26.0 - 34.0 pg   MCHC 32.7  30.0 - 36.0 g/dL   RDW 46.9  62.9 - 52.8 %   Platelets 372  150 - 400 K/uL  COMPREHENSIVE METABOLIC PANEL      Component Value Range   Sodium 137  135 - 145 mEq/L   Potassium 3.3 (*) 3.5 - 5.1 mEq/L   Chloride 104  96 - 112 mEq/L   CO2 21  19 - 32 mEq/L   Glucose, Bld 109 (*) 70 - 99 mg/dL   BUN 11  6 - 23 mg/dL   Creatinine, Ser 4.13  0.50 - 1.10 mg/dL   Calcium 8.6  8.4 - 24.4 mg/dL   Total Protein 6.9  6.0 -  8.3 g/dL   Albumin 2.9 (*) 3.5 - 5.2 g/dL   AST 15  0 - 37 U/L   ALT 14  0 - 35 U/L   Alkaline Phosphatase 71  39 - 117 U/L    Total Bilirubin 0.3  0.3 - 1.2 mg/dL   GFR calc non Af Amer 84 (*) >90 mL/min   GFR calc Af Amer >90  >90 mL/min  PROTIME-INR      Component Value Range   Prothrombin Time 15.3 (*) 11.6 - 15.2 seconds   INR 1.23  0.00 - 1.49   A/P: Pt with hx recent fall at home with subsequent rt abdominal pain and CT revealing a recurrent rt lateral abdominal wall fluid collection (hematoma vs abscess). Plan is for CT guided aspiration/possible drainage of collection today. Details/risks of procedure d/w pt with her understanding and consent.

## 2012-01-30 NOTE — Progress Notes (Signed)
Patient ID: Stephanie Coleman, female   DOB: 04-Jan-1946, 67 y.o.   MRN: [id number]    Subjective: Pt feels ok.  No complaints.  No pain except when you palpate this area of her abdomen  Objective: Vital signs in last 24 hours: Temp:  [97.5 F (36.4 C)-98.8 F (37.1 C)] 98.5 F (36.9 C) (01/19 4098) Pulse Rate:  [88-99] 99  (01/19 0608) Resp:  [16-20] 16  (01/19 0608) BP: (121-137)/(56-81) 137/67 mmHg (01/19 0608) SpO2:  [96 %-100 %] 96 % (01/19 1191) Weight:  [194 lb (87.998 kg)] 194 lb (87.998 kg) (01/18 1319) Last BM Date: 01/29/12  Intake/Output from previous day:   Intake/Output this shift:    PE: Abd: soft, collection palpable, mildly tender, ND  Lab Results:   Basename 01/30/12 0520 01/29/12 1338  WBC 7.4 8.9  HGB 10.4* 11.4*  HCT 31.8* 34.9*  PLT 372 413*   BMET  Basename 01/30/12 0520 01/29/12 1338  NA 137 138  K 3.3* 3.5  CL 104 101  CO2 21 22  GLUCOSE 109* 89  BUN 11 16  CREATININE 0.79 0.82  CALCIUM 8.6 9.3   PT/INR  Basename 01/30/12 0520  LABPROT 15.3*  INR 1.23   CMP     Component Value Date/Time   NA 137 01/30/2012 0520   K 3.3* 01/30/2012 0520   CL 104 01/30/2012 0520   CO2 21 01/30/2012 0520   GLUCOSE 109* 01/30/2012 0520   BUN 11 01/30/2012 0520   CREATININE 0.79 01/30/2012 0520   CALCIUM 8.6 01/30/2012 0520   PROT 6.9 01/30/2012 0520   ALBUMIN 2.9* 01/30/2012 0520   AST 15 01/30/2012 0520   ALT 14 01/30/2012 0520   ALKPHOS 71 01/30/2012 0520   BILITOT 0.3 01/30/2012 0520   GFRNONAA 84* 01/30/2012 0520   GFRAA >90 01/30/2012 0520   Lipase  No results found for this basename: lipase       Studies/Results: Ct Abdomen Pelvis W Contrast  01/29/2012  *RADIOLOGY REPORT*  Clinical Data: Fall on December 14.  Persistent right mid abdominal pain.  History of "internal abscess in this location."  CT ABDOMEN AND PELVIS WITH CONTRAST  Technique:  Multidetector CT imaging of the abdomen and pelvis was performed following the standard protocol during bolus  administration of intravenous contrast.  Contrast: OMNIPAQUE IOHEXOL 300 MG/ML  SOLN  Comparison: 10/19/2008  Findings: Lung bases:  Mild volume loss at the left lung base. Mild cardiomegaly, without pericardial or pleural effusion.  Abdomen/pelvis:  Normal liver, spleen, stomach.  Transverse duodenal diverticulum through which the common duct traverses.  No biliary ductal dilatation or pancreatic ductal dilatation. Cholecystectomy.  Normal adrenal glands and left kidney.  There is minimal right-sided caliectasis, indeterminate etiology.  Chronic. Favored to be within normal variation.  At the site of resolving fluid collection in 2010, under the anterior aspect right hemidiaphragm extending along the right perihepatic space, is a peripherally enhancing or thick-walled fluid collection which measures 8.6 x 6.9 cm image 39/series 2.  On coronal images, this measures 8.0 cm.  There is mild surrounding edema.  No gas within.  No communication with the adjacent colon.  No retroperitoneal or retrocrural adenopathy.  Normal colon and terminal ileum.  Appendix likely identified on image 60.  No right lower quadrant inflammation.  Normal small bowel.  No pelvic adenopathy.  Tiny focus of air within the nondependent urinary bladder.  Suspect a sub centimeter uterine body fibroid on image 75/series 2. No adnexal mass or significant free  fluid.  Bones/Musculoskeletal:  Advanced degenerative disc disease at the lumbosacral junction.  IMPRESSION: 1.  Development of a complex fluid collection in the right upper quadrant, at the site of a resolving collection back 2010.  This is suspicious for recurrent infection versus chronic hematoma. 2.  No other acute process in the abdomen or pelvis. 3.  Air within the urinary bladder, possibly iatrogenic. Clinically correlate.   Original Report Authenticated By: Jeronimo Greaves, M.D.     Anti-infectives: Anti-infectives    None       Assessment/Plan  1. Abdominal wall fluid  collection, likely hematoma  Plan: 1. IR to aspirate fluid collection.  No surgical intervention necessary. 2. Patient appears stable and in NAD  3. No current signs of infection.  Likely to be hematoma.  LOS: 1 day    Dearius Hoffmann E 01/30/2012, 8:02 AM Pager: [phone]

## 2012-01-31 LAB — BASIC METABOLIC PANEL
CO2: 20 mEq/L (ref 19–32)
Chloride: 104 mEq/L (ref 96–112)
Creatinine, Ser: 0.82 mg/dL (ref 0.50–1.10)
GFR calc Af Amer: 83 mL/min — ABNORMAL LOW (ref >90)
Potassium: 4.2 mEq/L (ref 3.5–5.1)
Sodium: 134 mEq/L — ABNORMAL LOW (ref 135–145)

## 2012-01-31 LAB — CBC
MCV: 78.6 fL (ref 78.0–100.0)
Platelets: 402 10*3/uL — ABNORMAL HIGH (ref 150–400)
RBC: 4.29 MIL/uL (ref 3.87–5.11)
RDW: 14.4 % (ref 11.5–15.5)
WBC: 7.7 10*3/uL (ref 4.0–10.5)

## 2012-01-31 LAB — GLUCOSE, CAPILLARY: Glucose-Capillary: 124 mg/dL — ABNORMAL HIGH (ref 70–99)

## 2012-01-31 MED ORDER — ENOXAPARIN SODIUM 30 MG/0.3ML ~~LOC~~ SOLN
30.0000 mg | SUBCUTANEOUS | Status: DC
  Administered 2012-01-31: 30 mg via SUBCUTANEOUS
  Filled 2012-01-31 (×2): qty 0.3

## 2012-01-31 MED ORDER — CIPROFLOXACIN HCL 500 MG PO TABS
500.0000 mg | ORAL_TABLET | Freq: Two times a day (BID) | ORAL | Status: DC
  Administered 2012-01-31 – 2012-02-01 (×3): 500 mg via ORAL
  Filled 2012-01-31 (×5): qty 1

## 2012-01-31 MED ORDER — METRONIDAZOLE 500 MG PO TABS
500.0000 mg | ORAL_TABLET | Freq: Three times a day (TID) | ORAL | Status: DC
  Administered 2012-01-31 – 2012-02-01 (×3): 500 mg via ORAL
  Filled 2012-01-31 (×6): qty 1

## 2012-01-31 NOTE — Progress Notes (Signed)
[facility] PROGRESS NOTE  Stephanie Coleman WUJ:[id number] DOB: 14-Jan-1945 DOA: 01/29/2012 PCP: No primary provider on file.  ASSESSMENT AND PLAN:  Present on Admission:   . Intra-abdominal fluid collection - abscess - she is now s/p IR guided drainage on Jan 30 2012;  - Pertinent microbiology shows gram-negative rods - We'll continue ciprofloxacin and metronidazole for now, we switched them to by mouth today. - If the patient does well today we will discharge her tomorrow. - She will followup with radiology, I will arrange for a CAT scan in about 7 days. - She does not have a primary care provider, she just got insurance since January and would like to be set up here.  DVT Prophylaxis:  - Lovenox subcutaneous  Code Status: Full code Family Communication: None (indicate person spoken with, relationship, and if by phone, the number) Disposition Plan: Home in 1-2 days once we have microbiology data and clinical improvement.   Consultants:  Surgery  Interventional radiology  Procedures:  IR guided abscess drainage  Antibiotics:  Ciprofloxacin 01/30/2012>>  Metronidazole 01/30/2012 >>  HPI/Subjective: Other than the right upper quadrant pain she has no complaints  Objective: Filed Vitals:   01/30/12 1009 01/30/12 1308 01/30/12 2200 01/31/12 0558  BP: 101/85 128/67 129/67 127/68  Pulse: 101 95 85 91  Temp:  98.4 F (36.9 C) 98.4 F (36.9 C) 98.8 F (37.1 C)  TempSrc:   Oral Oral  Resp:  16 16 17   Height:      Weight:      SpO2: 95% 97% 98% 97%    Intake/Output Summary (Last 24 hours) at 01/31/12 1151 Last data filed at 01/31/12 0700  Gross per 24 hour  Intake 1028.85 ml  Output     40 ml  Net 988.85 ml   Filed Weights   01/29/12 1319  Weight: 87.998 kg (194 lb)    Exam:   General:  NAD  Cardiovascular: RRR, no MRG  Respiratory: CTA biL  Abdomen: soft, mild tenderness to palpation RUQ, palpable collection  Data Reviewed: Basic Metabolic  Panel:  Lab 01/31/12 0615 01/30/12 0520 01/29/12 1338  NA 134* 137 138  K 4.2 3.3* 3.5  CL 104 104 101  CO2 20 21 22   GLUCOSE 113* 109* 89  BUN 10 11 16   CREATININE 0.82 0.79 0.82  CALCIUM 8.7 8.6 9.3  MG -- -- --  PHOS -- -- --   Liver Function Tests:  Lab 01/30/12 0520 01/29/12 1338  AST 15 16  ALT 14 17  ALKPHOS 71 85  BILITOT 0.3 0.1*  PROT 6.9 8.0  ALBUMIN 2.9* 3.4*   CBC:  Lab 01/31/12 0615 01/30/12 0520 01/29/12 1338  WBC 7.7 7.4 8.9  NEUTROABS -- -- 5.8  HGB 11.1* 10.4* 11.4*  HCT 33.7* 31.8* 34.9*  MCV 78.6 78.1 79.0  PLT 402* 372 413*   CBG:  Lab 01/31/12 0605 01/30/12 0901  GLUCAP 124* 120*   Studies: Ct Abdomen Pelvis W Contrast  01/29/2012  *RADIOLOGY REPORT*  Clinical Data: Fall on December 14.  Persistent right mid abdominal pain.  History of "internal abscess in this location."  CT ABDOMEN AND PELVIS WITH CONTRAST  Technique:  Multidetector CT imaging of the abdomen and pelvis was performed following the standard protocol during bolus administration of intravenous contrast.  Contrast: OMNIPAQUE IOHEXOL 300 MG/ML  SOLN  Comparison: 10/19/2008  Findings: Lung bases:  Mild volume loss at the left lung base. Mild cardiomegaly, without pericardial or pleural effusion.  Abdomen/pelvis:  Normal liver, spleen, stomach.  Transverse duodenal diverticulum through which the common duct traverses.  No biliary ductal dilatation or pancreatic ductal dilatation. Cholecystectomy.  Normal adrenal glands and left kidney.  There is minimal right-sided caliectasis, indeterminate etiology.  Chronic. Favored to be within normal variation.  At the site of resolving fluid collection in 2010, under the anterior aspect right hemidiaphragm extending along the right perihepatic space, is a peripherally enhancing or thick-walled fluid collection which measures 8.6 x 6.9 cm image 39/series 2.  On coronal images, this measures 8.0 cm.  There is mild surrounding edema.  No gas within.   No communication with the adjacent colon.  No retroperitoneal or retrocrural adenopathy.  Normal colon and terminal ileum.  Appendix likely identified on image 60.  No right lower quadrant inflammation.  Normal small bowel.  No pelvic adenopathy.  Tiny focus of air within the nondependent urinary bladder.  Suspect a sub centimeter uterine body fibroid on image 75/series 2. No adnexal mass or significant free fluid.  Bones/Musculoskeletal:  Advanced degenerative disc disease at the lumbosacral junction.  IMPRESSION: 1.  Development of a complex fluid collection in the right upper quadrant, at the site of a resolving collection back 2010.  This is suspicious for recurrent infection versus chronic hematoma. 2.  No other acute process in the abdomen or pelvis. 3.  Air within the urinary bladder, possibly iatrogenic. Clinically correlate.   Original Report Authenticated By: Jeronimo Greaves, M.D.     Scheduled Meds:    . ciprofloxacin  500 mg Oral BID  . metroNIDAZOLE  500 mg Oral Q8H  . polyethylene glycol  17 g Oral Daily   Continuous Infusions:    . sodium chloride 1,000 mL (01/31/12 0215)    Principal Problem:  *Intra-abdominal abscess Active Problems:  Intra-abdominal fluid collection   Stephanie Coleman  [facility] Pager [phone]. If 8PM-8AM, please contact night-coverage at [email or web address], password [facility] 01/31/2012, 11:51 AM  LOS: 2 days

## 2012-01-31 NOTE — Progress Notes (Signed)
Patient ID: Stephanie Coleman, female   DOB: [birth year], 67 y.o.   MRN: [id number]   LOS: 2 days   Subjective: Feels better.  Objective: Vital signs in last 24 hours: Temp:  [98.4 F (36.9 C)-98.8 F (37.1 C)] 98.8 F (37.1 C) (01/20 0558) Pulse Rate:  [85-101] 91  (01/20 0558) Resp:  [10-20] 17  (01/20 0558) BP: (101-135)/(64-85) 127/68 mmHg (01/20 0558) SpO2:  [95 %-100 %] 97 % (01/20 0558) Last BM Date: 01/30/12   Drain: 20ml/12h   Lab Results:  CBC  Basename 01/31/12 0615 01/30/12 0520  WBC 7.7 7.4  HGB 11.1* 10.4*  HCT 33.7* 31.8*  PLT 402* 372   BMET  Basename 01/31/12 0615 01/30/12 0520  NA 134* 137  K 4.2 3.3*  CL 104 104  CO2 20 21  GLUCOSE 113* 109*  BUN 10 11  CREATININE 0.82 0.79  CALCIUM 8.7 8.6    GI: Soft, NT, +BS. Drain with bloody, semipurulent fluid.   Assessment/Plan: Fall Infected abd wall hematoma s/p perc drain -- Cont drain per IR ID -- On cipro/flagyl. Awaiting culture results. Dispo -- No additional role for trauma to play. Will sign off, please call with questions.   Freeman Caldron, PA-C Pager: [phone] General Trauma PA Pager: [phone]   01/31/2012

## 2012-01-31 NOTE — Progress Notes (Signed)
Subjective: Rt abd wall abscess drain placed 1/19 Output good; bloody Pt feels better  Objective: Vital signs in last 24 hours: Temp:  [98.4 F (36.9 C)-98.8 F (37.1 C)] 98.8 F (37.1 C) (01/20 0558) Pulse Rate:  [85-101] 91  (01/20 0558) Resp:  [10-20] 17  (01/20 0558) BP: (101-135)/(64-85) 127/68 mmHg (01/20 0558) SpO2:  [95 %-100 %] 97 % (01/20 0558) Last BM Date: 01/30/12  Intake/Output from previous day: 01/19 0701 - 01/20 0700 In: 1305.9 [I.V.:593.9; IV Piggyback:702] Out: 260 [Drains:40] Intake/Output this shift:    PE:  Rt abd wall abs drain intact Output 40 cc yesterday 10 cc in bag now: bloody Cx pending Site clean and dry Sl tender Wbc wnl afeb; vss  Lab Results:   [facility] 01/31/12 0615 01/30/12 0520  WBC 7.7 7.4  HGB 11.1* 10.4*  HCT 33.7* 31.8*  PLT 402* 372   BMET  Basename 01/30/12 0520 01/29/12 1338  NA 137 138  K 3.3* 3.5  CL 104 101  CO2 21 22  GLUCOSE 109* 89  BUN 11 16  CREATININE 0.79 0.82  CALCIUM 8.6 9.3   PT/INR  Basename 01/30/12 0520  LABPROT 15.3*  INR 1.23   ABG No results found for this basename: PHART:2,PCO2:2,PO2:2,HCO3:2 in the last 72 hours  Studies/Results: Ct Abdomen Pelvis W Contrast  01/29/2012  *RADIOLOGY REPORT*  Clinical Data: Fall on December 14.  Persistent right mid abdominal pain.  History of "internal abscess in this location."  CT ABDOMEN AND PELVIS WITH CONTRAST  Technique:  Multidetector CT imaging of the abdomen and pelvis was performed following the standard protocol during bolus administration of intravenous contrast.  Contrast: OMNIPAQUE IOHEXOL 300 MG/ML  SOLN  Comparison: 10/19/2008  Findings: Lung bases:  Mild volume loss at the left lung base. Mild cardiomegaly, without pericardial or pleural effusion.  Abdomen/pelvis:  Normal liver, spleen, stomach.  Transverse duodenal diverticulum through which the common duct traverses.  No biliary ductal dilatation or pancreatic ductal dilatation.  Cholecystectomy.  Normal adrenal glands and left kidney.  There is minimal right-sided caliectasis, indeterminate etiology.  Chronic. Favored to be within normal variation.  At the site of resolving fluid collection in 2010, under the anterior aspect right hemidiaphragm extending along the right perihepatic space, is a peripherally enhancing or thick-walled fluid collection which measures 8.6 x 6.9 cm image 39/series 2.  On coronal images, this measures 8.0 cm.  There is mild surrounding edema.  No gas within.  No communication with the adjacent colon.  No retroperitoneal or retrocrural adenopathy.  Normal colon and terminal ileum.  Appendix likely identified on image 60.  No right lower quadrant inflammation.  Normal small bowel.  No pelvic adenopathy.  Tiny focus of air within the nondependent urinary bladder.  Suspect a sub centimeter uterine body fibroid on image 75/series 2. No adnexal mass or significant free fluid.  Bones/Musculoskeletal:  Advanced degenerative disc disease at the lumbosacral junction.  IMPRESSION: 1.  Development of a complex fluid collection in the right upper quadrant, at the site of a resolving collection back 2010.  This is suspicious for recurrent infection versus chronic hematoma. 2.  No other acute process in the abdomen or pelvis. 3.  Air within the urinary bladder, possibly iatrogenic. Clinically correlate.   Original Report Authenticated By: Jeronimo Greaves, M.D.     Anti-infectives: Anti-infectives     Start     Dose/Rate Route Frequency Ordered Stop   01/30/12 1200   metroNIDAZOLE (FLAGYL) IVPB 500 mg  500 mg 100 mL/hr over 60 Minutes Intravenous Every 8 hours 01/30/12 1039     01/30/12 1130   ciprofloxacin (CIPRO) IVPB 400 mg        400 mg 200 mL/hr over 60 Minutes Intravenous Every 12 hours 01/30/12 1039            Assessment/Plan: s/p  Rt abd wall abscess drain placed 1/19 Intact Doing well cx pending Will follow  LOS: 2 days    Icie Kuznicki  A 01/31/2012

## 2012-01-31 NOTE — Progress Notes (Signed)
WBC WNL Drain in place, F/U CXs.  Continue drain until output down as directed by IR. Patient examined and I agree with the assessment and plan  Violeta Gelinas, MD, MPH, FACS Pager: [phone]  01/31/2012 9:44 AM

## 2012-01-31 NOTE — Discharge Summary (Signed)
Physician Discharge Summary  Stephanie Coleman ZOX:[id number] DOB: 08-May-1945 DOA: 01/29/2012  PCP: No primary provider on file.  Admit date: 01/29/2012 Discharge date: 02/01/2012  Time spent: 45 minutes  Discharge Diagnoses:  Principal Problem:  *Intra-abdominal abscess Active Problems:  Intra-abdominal fluid collection  Discharge Condition: Stable  Diet recommendation: regular  Filed Weights   01/29/12 1319  Weight: 87.998 kg (194 lb)    History of present illness:  Patient is a 67 year old Caucasian female with a history of diet-controlled diabetes, who unfortunately sustained a fall on 12/25/11 while walking in the dark and missed a step. She fell forward and fell on her face, she sustained numerous bruising in her bilateral shoulder area, right knee, left thumb area. She then started having aches and pains all over her body from this. She did not seek any medical attention, and applied hot and cold packs and took over-the-counter medications for pain. Her pain in the other areas completely went away, however she then started to have persisting pain in the right upper quadrant area. She initially tried to manage this with NSAIDs and hot and cold packs. However for the past 10 days or so, pain has been unrelenting and 10/10 at times. She at times claims to felt slightly feverish, but there is no documented history of fever. She has been persistently afebrile here in the emergency room. Because of persistent pain, she presented to the urgent care and was then transferred to the ED for further evaluation. A CT scan of the abdomen done here in the emergency room shows a complex fluid collection in the right upper quadrant. She in 2010had intra-abdominal abscess his, that was treated with numerous antibiotics for 4 months by Dr. Donell Beers. Since a CT scan of the abdomen was read as either a chronic hematoma or recurrent infection, the ED physician spoke with surgery on call who subsequently recommended  medically admission and interventional radiology consult for drainage of the hematoma/abscess.  - She denies any headache, shortness of breath, chest pain, nausea vomiting or diarrhea. She denies any dysuria.  Hospital Course:  Patient presented with right upper quadrant pain, about a month after she suffered a fall. CT scan in the ED showed a right upper quadrant fluid collection. Interventional radiology was consulted, and she underwent IR guided drainage of the fluid collection on 01/30/2012. Fluid aspect showed as being an infection, and she was started on intravenous ciprofloxacin and metronidazole. Patient did well after the procedure, and she was normotensive and afebrile on the floor. On 01/31/2012, her antibiotics were switched to by mouth ciprofloxacin and by mouth metronidazole. Microbiology data did not showed Klebsiella sstv to Cipro and will continue that for at least 4 weeks (full course to be determined as an outpatient based on clinical and imaging parameters). Patient will be seen in the interventional radiology clinic in about 7-10 days following discharge. We'll arrange for an outpatient CAT scan of the abdomen around the time when she will have that appointment, to monitor evolution of her intra-abdominal abscess and has follow up with IR. Referred for primary care to establish with [place].  KLEBSIELLA PNEUMONIAE       Antibiotic  Sensitivity  Microscan  Status     AMPICILLIN   RESISTANT  Final     Method:  MIC     AMPICILLIN/SULBACTAM  Sensitive  4  Final     Method:  MIC     CEFAZOLIN  Sensitive  <=4  Final     Method:  MIC     CEFEPIME  Sensitive  <=1  Final     Method:  MIC     CEFOXITIN  Sensitive  <=4  Final     Method:  MIC     CEFTAZIDIME  Sensitive  <=1  Final     Method:  MIC     CEFTRIAXONE  Sensitive  <=1  Final     Method:  MIC     CIPROFLOXACIN  Sensitive  <=0.25  Final     Method:  MIC     GENTAMICIN  Sensitive  <=1  Final     Method:  MIC      IMIPENEM  Sensitive  <=0.25  Final     Method:  MIC     PIP/TAZO  Sensitive  <=4  Final     Method:  MIC     TOBRAMYCIN  Sensitive  <=1  Final     Method:  MIC     TRIMETH/SULFA  Sensitive  <=20  Final     Method:  MIC      Comments  KLEBSIELLA PNEUMONIAE (MIC)       MODERATE KLEBSIELLA PNEUMONIAE     Procedures:  I&D abscess   Consultations:  IR  Discharge Exam: Filed Vitals:   01/31/12 1300 01/31/12 1532 01/31/12 2038 02/01/12 0600  BP: 139/68 124/81 131/70 123/66  Pulse: 92 84 89 84  Temp: 97.7 F (36.5 C) 98 F (36.7 C) 98.6 F (37 C) 97.9 F (36.6 C)  TempSrc:      Resp: 18 18 18 18   Height:      Weight:      SpO2: 97% 98% 98% 96%    General: NAD  Cardiovascular: RRR, no MRG  Respiratory: CTA biL  Abdomen: soft, non tender, drain in place  Discharge Instructions  Discharge Orders    Future Orders Please Complete By Expires   CT Abdomen Pelvis W Contrast  02/07/12 05/01/13   Questions: Responses:   Preferred imaging location? [facility]   Reason for exam: 67 yo F with intraabdominal abscess with drain in place would like to evaluate course.   Ambulatory referral to Internal Medicine          Medication List     As of 02/01/2012  4:08 PM    TAKE these medications         BAYER BACK & BODY PAIN EX ST 500-32.5 MG Tabs   Generic drug: Aspirin-Caffeine   Take 2 tablets by mouth 2 (two) times daily as needed. For pain      ciprofloxacin 500 MG tablet   Commonly known as: CIPRO   Take 1 tablet (500 mg total) by mouth 2 (two) times daily.      estradiol 0.5 MG tablet   Commonly known as: ESTRACE   Take 0.5 mg by mouth daily.      medroxyPROGESTERone 2.5 MG tablet   Commonly known as: PROVERA   Take 2.5 mg by mouth daily.         The results of significant diagnostics from this hospitalization (including imaging, microbiology, ancillary and laboratory) are listed below for reference.    Significant Diagnostic Studies: Ct Abdomen  Pelvis W Contrast  01/29/2012  *RADIOLOGY REPORT*  Clinical Data: Fall on December 14.  Persistent right mid abdominal pain.  History of "internal abscess in this location."  CT ABDOMEN AND PELVIS WITH CONTRAST  Technique:  Multidetector CT imaging of the abdomen and pelvis was performed  following the standard protocol during bolus administration of intravenous contrast.  Contrast: OMNIPAQUE IOHEXOL 300 MG/ML  SOLN  Comparison: 10/19/2008  Findings: Lung bases:  Mild volume loss at the left lung base. Mild cardiomegaly, without pericardial or pleural effusion.  Abdomen/pelvis:  Normal liver, spleen, stomach.  Transverse duodenal diverticulum through which the common duct traverses.  No biliary ductal dilatation or pancreatic ductal dilatation. Cholecystectomy.  Normal adrenal glands and left kidney.  There is minimal right-sided caliectasis, indeterminate etiology.  Chronic. Favored to be within normal variation.  At the site of resolving fluid collection in 2010, under the anterior aspect right hemidiaphragm extending along the right perihepatic space, is a peripherally enhancing or thick-walled fluid collection which measures 8.6 x 6.9 cm image 39/series 2.  On coronal images, this measures 8.0 cm.  There is mild surrounding edema.  No gas within.  No communication with the adjacent colon.  No retroperitoneal or retrocrural adenopathy.  Normal colon and terminal ileum.  Appendix likely identified on image 60.  No right lower quadrant inflammation.  Normal small bowel.  No pelvic adenopathy.  Tiny focus of air within the nondependent urinary bladder.  Suspect a sub centimeter uterine body fibroid on image 75/series 2. No adnexal mass or significant free fluid.  Bones/Musculoskeletal:  Advanced degenerative disc disease at the lumbosacral junction.  IMPRESSION: 1.  Development of a complex fluid collection in the right upper quadrant, at the site of a resolving collection back 2010.  This is suspicious for  recurrent infection versus chronic hematoma. 2.  No other acute process in the abdomen or pelvis. 3.  Air within the urinary bladder, possibly iatrogenic. Clinically correlate.   Original Report Authenticated By: Jeronimo Greaves, M.D.    Labs: Basic Metabolic Panel:  Lab 01/31/12 0017 01/30/12 0520 01/29/12 1338  NA 134* 137 138  K 4.2 3.3* 3.5  CL 104 104 101  CO2 20 21 22   GLUCOSE 113* 109* 89  BUN 10 11 16   CREATININE 0.82 0.79 0.82  CALCIUM 8.7 8.6 9.3  MG -- -- --  PHOS -- -- --   Liver Function Tests:  Lab 01/30/12 0520 01/29/12 1338  AST 15 16  ALT 14 17  ALKPHOS 71 85  BILITOT 0.3 0.1*  PROT 6.9 8.0  ALBUMIN 2.9* 3.4*   CBC:  Lab 01/31/12 0615 01/30/12 0520 01/29/12 1338  WBC 7.7 7.4 8.9  NEUTROABS -- -- 5.8  HGB 11.1* 10.4* 11.4*  HCT 33.7* 31.8* 34.9*  MCV 78.6 78.1 79.0  PLT 402* 372 413*   Signed:  GHERGHE, COSTIN  [facility] 02/01/2012, 4:05 PM

## 2012-02-01 LAB — CULTURE, ROUTINE-ABSCESS

## 2012-02-01 MED ORDER — CIPROFLOXACIN HCL 500 MG PO TABS: 500.0000 mg | ORAL_TABLET | Freq: Two times a day (BID) | ORAL | Status: AC

## 2012-02-01 MED ORDER — CIPROFLOXACIN HCL 500 MG PO TABS: 500.0000 mg | ORAL_TABLET | Freq: Two times a day (BID) | ORAL | Status: DC

## 2012-02-01 NOTE — Progress Notes (Signed)
Patient provided with discharge instructions and follow up information. Educated how to do dressing changes every other day and demonstrated how to flush her tube daily. Patient is going to be discharged home with PO Cipro and has her appointment information.

## 2012-02-01 NOTE — Progress Notes (Signed)
[facility] PROGRESS NOTE  Stephanie Coleman AVW:[id number] DOB: 01-11-1946 DOA: 01/29/2012 PCP: No primary provider on file.  ASSESSMENT AND PLAN:  Present on Admission:   . Intra-abdominal fluid collection - abscess - she is now s/p IR guided drainage on Jan 30 2012;  - Pertinent microbiology shows   KLEBSIELLA PNEUMONIAE       Antibiotic  Sensitivity  Microscan  Status    AMPICILLIN   RESISTANT  Final    Method:  MIC    AMPICILLIN/SULBACTAM  Sensitive  4  Final    Method:  MIC    CEFAZOLIN  Sensitive  <=4  Final    Method:  MIC    CEFEPIME  Sensitive  <=1  Final    Method:  MIC    CEFOXITIN  Sensitive  <=4  Final    Method:  MIC    CEFTAZIDIME  Sensitive  <=1  Final    Method:  MIC    CEFTRIAXONE  Sensitive  <=1  Final    Method:  MIC    CIPROFLOXACIN  Sensitive  <=0.25  Final    Method:  MIC    GENTAMICIN  Sensitive  <=1  Final    Method:  MIC    IMIPENEM  Sensitive  <=0.25  Final    Method:  MIC    PIP/TAZO  Sensitive  <=4  Final    Method:  MIC    TOBRAMYCIN  Sensitive  <=1  Final    Method:  MIC    TRIMETH/SULFA  Sensitive  <=20  Final    Method:  MIC     Comments  KLEBSIELLA PNEUMONIAE (MIC)      MODERATE KLEBSIELLA PNEUMONIAE    - We'll narrow to ciprofloxacin po only. - can go home today - She will followup with radiology, I will arrange for a CAT scan in about 7 days. - She does not have a primary care provider, she just got insurance since January and would like to be set up here. Referral in EPIC made today.  DVT Prophylaxis:  - Lovenox subcutaneous  Code Status: Full code Family Communication: None (indicate person spoken with, relationship, and if by phone, the number) Disposition Plan: Home in 1-2 days once we have microbiology data and clinical improvement.   Consultants:  Surgery  Interventional radiology  Procedures:  IR guided abscess drainage  Antibiotics:  Ciprofloxacin 01/30/2012>>  Metronidazole 01/30/2012  >>02/01/2012  HPI/Subjective: Other than the right upper quadrant pain she has no complaints  Objective: Filed Vitals:   01/31/12 1300 01/31/12 1532 01/31/12 2038 02/01/12 0600  BP: 139/68 124/81 131/70 123/66  Pulse: 92 84 89 84  Temp: 97.7 F (36.5 C) 98 F (36.7 C) 98.6 F (37 C) 97.9 F (36.6 C)  TempSrc:      Resp: 18 18 18 18   Height:      Weight:      SpO2: 97% 98% 98% 96%    Intake/Output Summary (Last 24 hours) at 02/01/12 0953 Last data filed at 01/31/12 1600  Gross per 24 hour  Intake      5 ml  Output     15 ml  Net    -10 ml   Filed Weights   01/29/12 1319  Weight: 87.998 kg (194 lb)    Exam:   General:  NAD  Cardiovascular: RRR, no MRG  Respiratory: CTA biL  Abdomen: soft, non tender, drain in place  Data Reviewed: Basic Metabolic Panel:  Lab 01/31/12 8295  01/30/12 0520 01/29/12 1338  NA 134* 137 138  K 4.2 3.3* 3.5  CL 104 104 101  CO2 20 21 22   GLUCOSE 113* 109* 89  BUN 10 11 16   CREATININE 0.82 0.79 0.82  CALCIUM 8.7 8.6 9.3  MG -- -- --  PHOS -- -- --   Liver Function Tests:  Lab 01/30/12 0520 01/29/12 1338  AST 15 16  ALT 14 17  ALKPHOS 71 85  BILITOT 0.3 0.1*  PROT 6.9 8.0  ALBUMIN 2.9* 3.4*   CBC:  Lab 01/31/12 0615 01/30/12 0520 01/29/12 1338  WBC 7.7 7.4 8.9  NEUTROABS -- -- 5.8  HGB 11.1* 10.4* 11.4*  HCT 33.7* 31.8* 34.9*  MCV 78.6 78.1 79.0  PLT 402* 372 413*   CBG:  Lab 01/31/12 0605 01/30/12 0901  GLUCAP 124* 120*   Studies: No results found.  Scheduled Meds:    . ciprofloxacin  500 mg Oral BID  . enoxaparin (LOVENOX) injection  30 mg Subcutaneous Q24H  . metroNIDAZOLE  500 mg Oral Q8H  . polyethylene glycol  17 g Oral Daily   Continuous Infusions:    . sodium chloride 75 mL/hr at 02/01/12 0604    Principal Problem:  *Intra-abdominal abscess Active Problems:  Intra-abdominal fluid collection   [name]  [facility] Pager [phone]. If 8PM-8AM, please contact  night-coverage at [email or web address], password [facility] 02/01/2012, 9:53 AM  LOS: 3 days

## 2012-02-01 NOTE — Progress Notes (Signed)
  Subjective: Rt abd wall abscess drain placed 1/19 Still with good output Pt doing well  Objective: Vital signs in last 24 hours: Temp:  [97.7 F (36.5 C)-98.6 F (37 C)] 97.9 F (36.6 C) (01/21 0600) Pulse Rate:  [84-92] 84  (01/21 0600) Resp:  [18] 18  (01/21 0600) BP: (123-139)/(66-81) 123/66 mmHg (01/21 0600) SpO2:  [96 %-98 %] 96 % (01/21 0600) Last BM Date: 02/01/12  Intake/Output from previous day: 01/20 0701 - 01/21 0700 In: 5  Out: 15 [Drains:15] Intake/Output this shift:    PE:  Afeb; vss Output 20 cc yesterday Brownish fluid + Kleb Site clean and dry; NT Wbc wnl   Lab Results:   [facility] 01/31/12 0615 01/30/12 0520  WBC 7.7 7.4  HGB 11.1* 10.4*  HCT 33.7* 31.8*  PLT 402* 372   BMET  Basename 01/31/12 0615 01/30/12 0520  NA 134* 137  K 4.2 3.3*  CL 104 104  CO2 20 21  GLUCOSE 113* 109*  BUN 10 11  CREATININE 0.82 0.79  CALCIUM 8.7 8.6   PT/INR  Basename 01/30/12 0520  LABPROT 15.3*  INR 1.23   ABG No results found for this basename: PHART:2,PCO2:2,PO2:2,HCO3:2 in the last 72 hours  Studies/Results: No results found.  Anti-infectives:   Assessment/Plan: s/p Rt abd wall abscess drain 1/19 Significant output Keep drain for now Need Re CT when output minimal Can be as OP    LOS: 3 days    Ellieanna Funderburg A 02/01/2012

## 2012-02-07 ENCOUNTER — Ambulatory Visit (HOSPITAL_COMMUNITY)
Admission: RE | Admit: 2012-02-07 | Discharge: 2012-02-07 | Disposition: A | Discharge status: Home / Self Care | Payer: Medicare Other | Source: Ambulatory Visit | Attending: Internal Medicine | Admitting: Internal Medicine

## 2012-02-07 DIAGNOSIS — Z9089 Acquired absence of other organs: Secondary | ICD-10-CM | POA: Insufficient documentation

## 2012-02-07 DIAGNOSIS — Z09 Encounter for follow-up examination after completed treatment for conditions other than malignant neoplasm: Secondary | ICD-10-CM | POA: Insufficient documentation

## 2012-02-07 DIAGNOSIS — R188 Other ascites: Secondary | ICD-10-CM

## 2012-02-07 MED ORDER — IOHEXOL 300 MG/ML  SOLN
80.0000 mL | Freq: Once | INTRAMUSCULAR | Status: AC | PRN
  Administered 2012-02-07: 80 mL via INTRAVENOUS

## 2012-08-16 ENCOUNTER — Other Ambulatory Visit: Payer: Self-pay

## 2012-11-16 ENCOUNTER — Other Ambulatory Visit: Payer: Self-pay

## 2016-07-11 DEATH — deceased

## 2016-11-09 DIAGNOSIS — K6389 Other specified diseases of intestine: Secondary | ICD-10-CM | POA: Diagnosis not present

## 2016-11-09 DIAGNOSIS — D122 Benign neoplasm of ascending colon: Secondary | ICD-10-CM | POA: Diagnosis not present

## 2016-11-09 DIAGNOSIS — D123 Benign neoplasm of transverse colon: Secondary | ICD-10-CM | POA: Diagnosis not present

## 2016-11-09 DIAGNOSIS — Z86010 Personal history of colonic polyps: Secondary | ICD-10-CM | POA: Diagnosis not present

## 2017-09-02 ENCOUNTER — Encounter: Payer: Self-pay | Admitting: Medical

## 2017-09-02 ENCOUNTER — Ambulatory Visit (INDEPENDENT_AMBULATORY_CARE_PROVIDER_SITE_OTHER): Payer: Medicare HMO | Admitting: Medical

## 2017-09-02 VITALS — BP 120/75 | HR 96 | Temp 98.1°F | Resp 16 | Ht 63.0 in | Wt 197.0 lb

## 2017-09-02 DIAGNOSIS — R5383 Other fatigue: Secondary | ICD-10-CM

## 2017-09-02 DIAGNOSIS — F329 Major depressive disorder, single episode, unspecified: Secondary | ICD-10-CM

## 2017-09-02 DIAGNOSIS — L409 Psoriasis, unspecified: Secondary | ICD-10-CM | POA: Diagnosis not present

## 2017-09-02 DIAGNOSIS — E119 Type 2 diabetes mellitus without complications: Secondary | ICD-10-CM

## 2017-09-02 DIAGNOSIS — E669 Obesity, unspecified: Secondary | ICD-10-CM | POA: Diagnosis not present

## 2017-09-02 DIAGNOSIS — F32A Depression, unspecified: Secondary | ICD-10-CM

## 2017-09-02 DIAGNOSIS — M2550 Pain in unspecified joint: Secondary | ICD-10-CM | POA: Diagnosis not present

## 2017-09-02 DIAGNOSIS — R21 Rash and other nonspecific skin eruption: Secondary | ICD-10-CM | POA: Diagnosis not present

## 2017-09-02 MED ORDER — BETAMETHASONE DIPROPIONATE 0.05 % EX CREA: TOPICAL_CREAM | Freq: Two times a day (BID) | CUTANEOUS | 0 refills | Status: DC

## 2017-09-02 NOTE — Progress Notes (Signed)
Subjective:    Patient ID: Stephanie Coleman, female    DOB: [birth year], 72 y.o.   MRN: [id number]  HPI  Pt in for first time.   Pt works at [employer]). Pt not exercising regularly.   Pt has history of thick rash in past present lower ext, hands and lower neck. Pt states had this for years and tried a lot of otc creams. Pt had not seen pcp for years. Last physical was in 2012. Recently rash on her rash/hand worse than usual.  Pt has remote history depression 2 years ago. She gained a lot of weight. Pt states her mood is better recently but not ideal. Pt states does no suffer from anxiety.  Pt used to be on hormones. She used to be on meds for several years. Pt went through premature ovarian failure at 72 year of age.   Pt is past had borderline elevated cholesterol.   Pt is obese.  She was told in past she has history of diabetes. In the past she was told to take statin and bp meds. She took metfomrin only briefly.  Pt had papsmears in the past. All where normal. Mammogram in past were all normal.  Some hx of fatigue.  Pt had history of colorectal surgery for polyp. Pt last colonsocpy 2018 and normal. Told to repeat in 3 years.    Review of Systems  Constitutional: Positive for fatigue.  HENT: Negative for congestion and drooling.   Respiratory: Negative for cough, choking, shortness of breath and wheezing.   Cardiovascular: Negative for chest pain and palpitations.  Gastrointestinal: Negative for abdominal pain and blood in stool.  Musculoskeletal: Negative for back pain.  Skin: Positive for rash.  Neurological: Negative for dizziness and headaches.  Hematological: Negative for adenopathy. Does not bruise/bleed easily.  Psychiatric/Behavioral: Positive for dysphoric mood. Negative for sleep disturbance. The patient is not nervous/anxious.     Past Medical History:  Diagnosis Date  . DDD (degenerative disc disease), lumbar   . Depression   . Diabetes  mellitus without complication (HCC)    diet controlled  . Hyperlipidemia    pt states borderline  . Obesity      Social History   Socioeconomic History  . Marital status: Divorced    Spouse name: Not on file  . Number of children: Not on file  . Years of education: Not on file  . Highest education level: Not on file  Occupational History  . Not on file  Social Needs  . Financial resource strain: Not on file  . Food insecurity:    Worry: Not on file    Inability: Not on file  . Transportation needs:    Medical: Not on file    Non-medical: Not on file  Tobacco Use  . Smoking status: Never Smoker  . Smokeless tobacco: Never Used  Substance and Sexual Activity  . Alcohol use: No  . Drug use: No  . Sexual activity: Never  Lifestyle  . Physical activity:    Days per week: Not on file    Minutes per session: Not on file  . Stress: Not on file  Relationships  . Social connections:    Talks on phone: Not on file    Gets together: Not on file    Attends religious service: Not on file    Active member of club or organization: Not on file    Attends meetings of clubs or organizations: Not on file  Relationship status: Not on file  . Intimate partner violence:    Fear of current or ex partner: Not on file    Emotionally abused: Not on file    Physically abused: Not on file    Forced sexual activity: Not on file  Other Topics Concern  . Not on file  Social History Narrative  . Not on file    Past Surgical History:  Procedure Laterality Date  . BACK SURGERY    . CHOLECYSTECTOMY    . colorectal surgery      No family history on file.  Allergies  Allergen Reactions  . Food Other (See Comments)    Gluten causes extreme intestinal distress     Current Outpatient Medications on File Prior to Visit  Medication Sig Dispense Refill  . Aspirin-Caffeine (BAYER BACK & BODY PAIN EX ST) 500-32.5 MG TABS Take 2 tablets by mouth 2 (two) times daily as needed. For pain      . estradiol (ESTRACE) 0.5 MG tablet Take 0.5 mg by mouth daily.    . medroxyPROGESTERone (PROVERA) 2.5 MG tablet Take 2.5 mg by mouth daily.     No current facility-administered medications on file prior to visit.     BP 120/75   Pulse 96   Temp 98.1 F (36.7 C) (Oral)   Resp 16   Ht 5\' 3"  (1.6 m)   Wt 197 lb (89.4 kg)   SpO2 98%   BMI 34.90 kg/m       Objective:   Physical Exam  General Mental Status- Alert. General Appearance- Not in acute distress.   Skin General: Color- Normal Color. Moisture- Normal Moisture. Rt middle finger- mcp to pip thick, lichinified red rash. Left 5th digit- base of thumb lichinified rash. Mild rash under chin.  Neck Carotid Arteries- Normal color. Moisture- Normal Moisture. No carotid bruits. No JVD.  Chest and Lung Exam Auscultation: Breath Sounds:-Normal.  Cardiovascular Auscultation:Rythm- Regular. Murmurs & Other Heart Sounds:Auscultation of the heart reveals- No Murmurs.  Abdomen Inspection:-Inspeection Normal. Palpation/Percussion:Note:No mass. Palpation and Percussion of the abdomen reveal- Non Tender, Non Distended + BS, no rebound or guarding.   Neurologic  Cranial Nerve exam:- CN III-XII intact(No nystagmus), symmetric smile. Strength:- 5/5 equal and symmetric strength both upper and lower extremities.     Assessment & Plan:  For your history of rash that appears likely psoriasis, I did prescribe you a steroid cream to apply to your hands.  This is where the stronger steroid creams and is not recommended to use on your face.  If you do get any rash on your face symmetry on your hands then would recommend that you use hydrocortisone.  Based on the distribution of your rash and length of time that you had this condition, I do think it is best to go ahead and refer you to a dermatologist.  I placed that referral and hopefully will get a call regarding date of that appointment within a couple of weeks.  For history of  diabetes, recommend low sugar diet and will get metabolic panel and O9G next Tuesday.  It is important to keep your blood sugar under control and I might recommend medication depending on how high your average sugar level labs come back.  You expressed desire to lose weight and just want to point out that metformin is a medication that is sometimes helpful with weight loss.  For fatigue, I did add labs to include CBC, B12, B1, TSH and vitamin D.  You do  report some arthralgias and will add inflammation studies.  Follow-up date to be determined after lab review.  Mackie Pai, PA-C

## 2017-09-02 NOTE — Patient Instructions (Signed)
For your history of rash that appears likely psoriasis, I did prescribe you a steroid cream to apply to your hands.  This is where the stronger steroid creams and is not recommended to use on your face.  If you do get any rash on your face symmetry on your hands then would recommend that you use hydrocortisone.  Based on the distribution of your rash and length of time that you had this condition, I do think it is best to go ahead and refer you to a dermatologist.  I placed that referral and hopefully will get a call regarding date of that appointment within a couple of weeks.  For history of diabetes, recommend low sugar diet and will get metabolic panel and U1J next Tuesday.  It is important to keep your blood sugar under control and I might recommend medication depending on how high your average sugar level labs come back.  You expressed desire to lose weight and just want to point out that metformin is a medication that is sometimes helpful with weight loss.  For fatigue, I did add labs to include CBC, B12, B1, TSH and vitamin D.  You do report some arthralgias and will add inflammation studies.  Follow-up date to be determined after lab review.

## 2017-09-06 ENCOUNTER — Other Ambulatory Visit (INDEPENDENT_AMBULATORY_CARE_PROVIDER_SITE_OTHER): Payer: Medicare HMO

## 2017-09-06 DIAGNOSIS — M2550 Pain in unspecified joint: Secondary | ICD-10-CM

## 2017-09-06 DIAGNOSIS — R5383 Other fatigue: Secondary | ICD-10-CM

## 2017-09-06 DIAGNOSIS — E119 Type 2 diabetes mellitus without complications: Secondary | ICD-10-CM

## 2017-09-06 LAB — CBC WITH DIFFERENTIAL/PLATELET
Basophils Absolute: 0 10*3/uL (ref 0.0–0.1)
Basophils Relative: 0.3 % (ref 0.0–3.0)
Eosinophils Absolute: 0.2 10*3/uL (ref 0.0–0.7)
Eosinophils Relative: 3 % (ref 0.0–5.0)
HCT: 31.5 % — ABNORMAL LOW (ref 36.0–46.0)
Hemoglobin: 9.9 g/dL — ABNORMAL LOW (ref 12.0–15.0)
Lymphocytes Relative: 28.4 % (ref 12.0–46.0)
Lymphs Abs: 1.5 10*3/uL (ref 0.7–4.0)
MCHC: 31.5 g/dL (ref 30.0–36.0)
MCV: 65.4 fl — ABNORMAL LOW (ref 78.0–100.0)
Monocytes Absolute: 0.5 10*3/uL (ref 0.1–1.0)
Monocytes Relative: 10.1 % (ref 3.0–12.0)
Neutro Abs: 3.1 10*3/uL (ref 1.4–7.7)
Neutrophils Relative %: 58.2 % (ref 43.0–77.0)
Platelets: 337 10*3/uL (ref 150.0–400.0)
RBC: 4.82 Mil/uL (ref 3.87–5.11)
RDW: 18.4 % — ABNORMAL HIGH (ref 11.5–15.5)
WBC: 5.4 10*3/uL (ref 4.0–10.5)

## 2017-09-06 LAB — LIPID PANEL
CHOL/HDL RATIO: 3
Cholesterol: 181 mg/dL (ref 0–200)
HDL: 53.3 mg/dL (ref >39.00)
LDL Cholesterol: 104 mg/dL — ABNORMAL HIGH (ref 0–99)
NONHDL: 127.4
Triglycerides: 116 mg/dL (ref 0.0–149.0)
VLDL: 23.2 mg/dL (ref 0.0–40.0)

## 2017-09-06 LAB — VITAMIN B12: VITAMIN B 12: 1467 pg/mL — AB (ref 211–911)

## 2017-09-06 LAB — COMPREHENSIVE METABOLIC PANEL
ALT: 15 U/L (ref 0–35)
AST: 16 U/L (ref 0–37)
Albumin: 4.2 g/dL (ref 3.5–5.2)
Alkaline Phosphatase: 103 U/L (ref 39–117)
BUN: 15 mg/dL (ref 6–23)
CO2: 26 meq/L (ref 19–32)
CREATININE: 0.97 mg/dL (ref 0.40–1.20)
Calcium: 10.5 mg/dL (ref 8.4–10.5)
Chloride: 104 mEq/L (ref 96–112)
GFR: 59.67 mL/min — AB (ref >60.00)
GLUCOSE: 156 mg/dL — AB (ref 70–99)
Potassium: 4.4 mEq/L (ref 3.5–5.1)
SODIUM: 138 meq/L (ref 135–145)
TOTAL PROTEIN: 7.2 g/dL (ref 6.0–8.3)
Total Bilirubin: 0.6 mg/dL (ref 0.2–1.2)

## 2017-09-06 LAB — C-REACTIVE PROTEIN: CRP: 0.4 mg/dL — AB (ref 0.5–20.0)

## 2017-09-06 LAB — SEDIMENTATION RATE: Sed Rate: 35 mm/hr — ABNORMAL HIGH (ref 0–30)

## 2017-09-06 LAB — TSH: TSH: 2.95 u[IU]/mL (ref 0.35–4.50)

## 2017-09-06 LAB — VITAMIN D 25 HYDROXY (VIT D DEFICIENCY, FRACTURES): VITD: 24.19 ng/mL — ABNORMAL LOW (ref 30.00–100.00)

## 2017-09-08 LAB — ANA: ANA: NEGATIVE

## 2017-09-08 LAB — RHEUMATOID FACTOR: Rhuematoid fact SerPl-aCnc: <14 IU/mL (ref <14)

## 2017-09-10 LAB — VITAMIN B1: Vitamin B1 (Thiamine): <6 nmol/L — ABNORMAL LOW (ref 8–30)

## 2017-09-11 ENCOUNTER — Telehealth: Payer: Self-pay | Admitting: Medical

## 2017-09-11 DIAGNOSIS — D649 Anemia, unspecified: Secondary | ICD-10-CM

## 2017-09-11 NOTE — Telephone Encounter (Signed)
Future cbc, iron studies and ifob placed.

## 2017-09-13 ENCOUNTER — Telehealth: Payer: Self-pay | Admitting: Medical

## 2017-09-13 DIAGNOSIS — R7989 Other specified abnormal findings of blood chemistry: Secondary | ICD-10-CM

## 2017-09-13 MED ORDER — VITAMIN D (ERGOCALCIFEROL) 1.25 MG (50000 UNIT) PO CAPS: 50000.0000 [IU] | ORAL_CAPSULE | ORAL | 0 refills | Status: DC

## 2017-09-13 NOTE — Telephone Encounter (Signed)
I sent in rx of vit d to her pharmacy. She can get level checked in 8 weeks. Future order vit d level placed.

## 2017-09-13 NOTE — Telephone Encounter (Signed)
Copied from [place] [id number]. Topic: General - Other >> Sep 13, 2017 12:38 PM Cecelia Byars, NT wrote: Reason for CRM: Patient called and said that she was supposed have a prescription for vitamin d sent to [facility] ([address]), [place] [phone] (Phone) [phone] (Fax) and to please call her once it sent she can start and scheduled for her labs as soon as possible thanks

## 2017-09-14 NOTE — Telephone Encounter (Addendum)
Called patient. Patient is aware of Rx and to call in for appoointment

## 2017-11-09 ENCOUNTER — Ambulatory Visit (INDEPENDENT_AMBULATORY_CARE_PROVIDER_SITE_OTHER): Payer: Medicare HMO | Admitting: Medical

## 2017-11-09 ENCOUNTER — Encounter: Payer: Self-pay | Admitting: Medical

## 2017-11-09 VITALS — BP 137/80 | HR 89 | Temp 98.4°F | Resp 16 | Ht 63.0 in | Wt 194.8 lb

## 2017-11-09 DIAGNOSIS — D649 Anemia, unspecified: Secondary | ICD-10-CM

## 2017-11-09 DIAGNOSIS — R7989 Other specified abnormal findings of blood chemistry: Secondary | ICD-10-CM

## 2017-11-09 DIAGNOSIS — R739 Hyperglycemia, unspecified: Secondary | ICD-10-CM

## 2017-11-09 DIAGNOSIS — R944 Abnormal results of kidney function studies: Secondary | ICD-10-CM | POA: Diagnosis not present

## 2017-11-09 DIAGNOSIS — L409 Psoriasis, unspecified: Secondary | ICD-10-CM

## 2017-11-09 NOTE — Progress Notes (Signed)
Subjective:    Patient ID: Stephanie Coleman, female    DOB: [birth year], 72 y.o.   MRN: [id number]  HPI  Pt in for a follow up.  I had referred her to dermatologist.  2 office today her they are not seeing new patient until January and per pt they would not even take her information. [place] told he the same.   Pt has history of thick rash in past present lower ext, hands and lower neck. Pt states had this for years and tried a lot of otc creams. Pt had not seen pcp for years. Last physical was in 2012. Recently rash on her rash/hand worse than usual.  Last time I gave steroid cream diprolene to patch on rt hand and area cleared.   Pt had low vit D. She had low vitamin D. She did take the medications. She had various symptoms ranging from sinus pressure,teeth pain and face  skin rash. She attributes this to vit D. But she has been off of vit D for one week and all symptoms have resolved.   Pt cholesterol mild elevated on last check.  She has elevated sugar and low gfr.  Also anemia on last labs. Per pt she has ha anemia  since 1964. Would take iron peridically then stop.  Pt last colonoscopy done one year ago. Told repeat in 3 years. Hx of polyp. No black of blood stools.  Pt used to be on provera in 2012. She wants to restart this. Last mammogram 2011. Pt still has uterus. Pt last papsmear in 2011 and was normal.     Review of Systems  Constitutional: Negative for chills, fatigue and fever.  Respiratory: Negative for cough, chest tightness, shortness of breath and wheezing.   Cardiovascular: Negative for chest pain and palpitations.  Gastrointestinal: Negative for abdominal pain.  Endocrine: Negative for polydipsia, polyphagia and polyuria.       Pt went through early menopause. She states still has hot flashes.  Skin: Positive for rash.       See hpi.  Neurological: Negative for dizziness, syncope, weakness, numbness and headaches.    Hematological: Negative for adenopathy. Does not bruise/bleed easily.  Psychiatric/Behavioral: Negative for behavioral problems, confusion, dysphoric mood and suicidal ideas. The patient is not nervous/anxious.        Not reporting any depression or anxiety today.       Objective:   Physical Exam  General Mental Status- Alert. General Appearance- Not in acute distress.   Skin Rt hand- 3rd digit former scaly patch now resolved.  Neck Carotid Arteries- Normal color. Moisture- Normal Moisture. No carotid bruits. No JVD.  Chest and Lung Exam Auscultation: Breath Sounds:-Normal.  Cardiovascular Auscultation:Rythm- Regular. Murmurs & Other Heart Sounds:Auscultation of the heart reveals- No Murmurs.  Abdomen Inspection:-Inspeection Normal. Palpation/Percussion:Note:No mass. Palpation and Percussion of the abdomen reveal- Non Tender, Non Distended + BS, no rebound or guarding.    Neurologic Cranial Nerve exam:- CN III-XII intact(No nystagmus), symmetric smile. Strength:- 5/5 equal and symmetric strength both upper and lower extremities.      Assessment & Plan:  For history of probable psoriasis, I will send message to referral staff and see if we can get you appointment within the next month or 2.  Continue to use Diprolene on your hands if needed.  For low GFR, I placed future metabolic panel to be done.  For low vitamin D, placed future order vitamin D.  You reported various symptoms  while you are taking vitamin D.  So we will follow your level and notify you of results but would likely not recommend using vitamin D again as you has such a broad range of symptoms while you are on that.  For history of anemia, placed future lab orders to get CBC, iron and ferritin.  Also want to turn in ifob.  You report a history of premature menopause in late 61s and hot flashes since then.  You requested a Provera prescription.  Need to look into full benefits and risk in your age group.   We will update you to see if I can write that.  Also might refer you to gynecologist.  Follow-up date to be determined after lab review.  Mackie Pai, PA-C

## 2017-11-09 NOTE — Patient Instructions (Signed)
For history of probable psoriasis, I will send message to referral staff and see if we can get you appointment within the next month or 2.  Continue to use Diprolene on your hands if needed.  For low GFR, I placed future metabolic panel to be done.  For low vitamin D, placed future order vitamin D.  You reported various symptoms while you are taking vitamin D.  So we will follow your level and notify you of results but would likely not recommend using vitamin D again as you has such a broad range of symptoms while you are on that.  For history of anemia, placed future lab orders to get CBC, iron and ferritin.  Also want to turn in ifob.  You report a history of premature menopause in late 21s and hot flashes since then.  You requested a Provera prescription.  Need to look into full benefits and risk in your age group.  We will update you to see if I can write that.  Also might refer you to gynecologist.  Follow-up date to be determined after lab review.

## 2017-11-11 ENCOUNTER — Telehealth: Payer: Self-pay | Admitting: Medical

## 2017-11-11 ENCOUNTER — Other Ambulatory Visit (INDEPENDENT_AMBULATORY_CARE_PROVIDER_SITE_OTHER): Payer: Medicare HMO

## 2017-11-11 DIAGNOSIS — R944 Abnormal results of kidney function studies: Secondary | ICD-10-CM | POA: Diagnosis not present

## 2017-11-11 DIAGNOSIS — R739 Hyperglycemia, unspecified: Secondary | ICD-10-CM

## 2017-11-11 LAB — COMPREHENSIVE METABOLIC PANEL
ALBUMIN: 4.1 g/dL (ref 3.5–5.2)
ALT: 14 U/L (ref 0–35)
AST: 16 U/L (ref 0–37)
Alkaline Phosphatase: 105 U/L (ref 39–117)
BUN: 10 mg/dL (ref 6–23)
CHLORIDE: 106 meq/L (ref 96–112)
CO2: 26 mEq/L (ref 19–32)
Calcium: 10.2 mg/dL (ref 8.4–10.5)
Creatinine, Ser: 0.81 mg/dL (ref 0.40–1.20)
GFR: 73.43 mL/min (ref >60.00)
Glucose, Bld: 130 mg/dL — ABNORMAL HIGH (ref 70–99)
POTASSIUM: 4.2 meq/L (ref 3.5–5.1)
Sodium: 140 mEq/L (ref 135–145)
Total Bilirubin: 0.4 mg/dL (ref 0.2–1.2)
Total Protein: 6.9 g/dL (ref 6.0–8.3)

## 2017-11-11 LAB — HEMOGLOBIN A1C: HEMOGLOBIN A1C: 7.7 % — AB (ref 4.6–6.5)

## 2017-11-11 MED ORDER — METFORMIN HCL 500 MG PO TABS: 500.0000 mg | ORAL_TABLET | Freq: Two times a day (BID) | ORAL | 3 refills | Status: DC

## 2017-11-11 NOTE — Telephone Encounter (Signed)
Rx metformin sent to pt pharmacy. 

## 2018-03-05 ENCOUNTER — Emergency Department (HOSPITAL_COMMUNITY): Payer: Medicare HMO

## 2018-03-05 ENCOUNTER — Inpatient Hospital Stay (HOSPITAL_COMMUNITY)
Admission: EM | Admit: 2018-03-05 | Discharge: 2018-03-08 | DRG: 470 | Disposition: A | Discharge status: Home Health | Payer: Medicare HMO | Attending: Internal Medicine | Admitting: Internal Medicine

## 2018-03-05 DIAGNOSIS — W1830XA Fall on same level, unspecified, initial encounter: Secondary | ICD-10-CM | POA: Diagnosis present

## 2018-03-05 DIAGNOSIS — M19042 Primary osteoarthritis, left hand: Secondary | ICD-10-CM | POA: Diagnosis not present

## 2018-03-05 DIAGNOSIS — S72042A Displaced fracture of base of neck of left femur, initial encounter for closed fracture: Secondary | ICD-10-CM | POA: Diagnosis not present

## 2018-03-05 DIAGNOSIS — Z23 Encounter for immunization: Secondary | ICD-10-CM | POA: Diagnosis not present

## 2018-03-05 DIAGNOSIS — W19XXXA Unspecified fall, initial encounter: Secondary | ICD-10-CM

## 2018-03-05 DIAGNOSIS — E119 Type 2 diabetes mellitus without complications: Secondary | ICD-10-CM | POA: Diagnosis present

## 2018-03-05 DIAGNOSIS — I10 Essential (primary) hypertension: Secondary | ICD-10-CM | POA: Diagnosis not present

## 2018-03-05 DIAGNOSIS — Z96642 Presence of left artificial hip joint: Secondary | ICD-10-CM | POA: Diagnosis not present

## 2018-03-05 DIAGNOSIS — E785 Hyperlipidemia, unspecified: Secondary | ICD-10-CM | POA: Diagnosis not present

## 2018-03-05 DIAGNOSIS — Z471 Aftercare following joint replacement surgery: Secondary | ICD-10-CM | POA: Diagnosis not present

## 2018-03-05 DIAGNOSIS — Z79899 Other long term (current) drug therapy: Secondary | ICD-10-CM

## 2018-03-05 DIAGNOSIS — Z888 Allergy status to other drugs, medicaments and biological substances status: Secondary | ICD-10-CM

## 2018-03-05 DIAGNOSIS — M5136 Other intervertebral disc degeneration, lumbar region: Secondary | ICD-10-CM | POA: Diagnosis not present

## 2018-03-05 DIAGNOSIS — Z7984 Long term (current) use of oral hypoglycemic drugs: Secondary | ICD-10-CM

## 2018-03-05 DIAGNOSIS — R52 Pain, unspecified: Secondary | ICD-10-CM | POA: Diagnosis not present

## 2018-03-05 DIAGNOSIS — E669 Obesity, unspecified: Secondary | ICD-10-CM | POA: Diagnosis present

## 2018-03-05 DIAGNOSIS — Z6834 Body mass index (BMI) 34.0-34.9, adult: Secondary | ICD-10-CM

## 2018-03-05 DIAGNOSIS — F329 Major depressive disorder, single episode, unspecified: Secondary | ICD-10-CM | POA: Diagnosis present

## 2018-03-05 DIAGNOSIS — S72002A Fracture of unspecified part of neck of left femur, initial encounter for closed fracture: Secondary | ICD-10-CM | POA: Diagnosis not present

## 2018-03-05 DIAGNOSIS — Z9049 Acquired absence of other specified parts of digestive tract: Secondary | ICD-10-CM

## 2018-03-05 DIAGNOSIS — S79912A Unspecified injury of left hip, initial encounter: Secondary | ICD-10-CM | POA: Diagnosis not present

## 2018-03-05 DIAGNOSIS — Z91018 Allergy to other foods: Secondary | ICD-10-CM | POA: Diagnosis not present

## 2018-03-05 DIAGNOSIS — Z91040 Latex allergy status: Secondary | ICD-10-CM

## 2018-03-05 DIAGNOSIS — Z7982 Long term (current) use of aspirin: Secondary | ICD-10-CM

## 2018-03-05 DIAGNOSIS — D62 Acute posthemorrhagic anemia: Secondary | ICD-10-CM | POA: Diagnosis not present

## 2018-03-05 DIAGNOSIS — M25552 Pain in left hip: Secondary | ICD-10-CM | POA: Diagnosis not present

## 2018-03-05 DIAGNOSIS — Z96649 Presence of unspecified artificial hip joint: Secondary | ICD-10-CM

## 2018-03-05 DIAGNOSIS — Z419 Encounter for procedure for purposes other than remedying health state, unspecified: Secondary | ICD-10-CM

## 2018-03-05 DIAGNOSIS — S61412A Laceration without foreign body of left hand, initial encounter: Secondary | ICD-10-CM | POA: Diagnosis present

## 2018-03-05 LAB — CBC WITH DIFFERENTIAL/PLATELET
Abs Immature Granulocytes: 0.04 10*3/uL (ref 0.00–0.07)
Basophils Absolute: 0 10*3/uL (ref 0.0–0.1)
Basophils Relative: 0 %
Eosinophils Absolute: 0 10*3/uL (ref 0.0–0.5)
Eosinophils Relative: 0 %
HCT: 31.8 % — ABNORMAL LOW (ref 36.0–46.0)
Hemoglobin: 9 g/dL — ABNORMAL LOW (ref 12.0–15.0)
Immature Granulocytes: 0 %
Lymphocytes Relative: 11 %
Lymphs Abs: 1.1 10*3/uL (ref 0.7–4.0)
MCH: 19.9 pg — ABNORMAL LOW (ref 26.0–34.0)
MCHC: 28.3 g/dL — ABNORMAL LOW (ref 30.0–36.0)
MCV: 70.2 fL — ABNORMAL LOW (ref 80.0–100.0)
Monocytes Absolute: 0.4 10*3/uL (ref 0.1–1.0)
Monocytes Relative: 5 %
Neutro Abs: 7.8 10*3/uL — ABNORMAL HIGH (ref 1.7–7.7)
Neutrophils Relative %: 84 %
Platelets: 284 10*3/uL (ref 150–400)
RBC: 4.53 MIL/uL (ref 3.87–5.11)
RDW: 18.6 % — ABNORMAL HIGH (ref 11.5–15.5)
WBC: 9.4 10*3/uL (ref 4.0–10.5)
nRBC: 0 % (ref 0.0–0.2)

## 2018-03-05 LAB — BASIC METABOLIC PANEL
Anion gap: 5 (ref 5–15)
BUN: 11 mg/dL (ref 8–23)
CO2: 23 mmol/L (ref 22–32)
Calcium: 9.4 mg/dL (ref 8.9–10.3)
Chloride: 110 mmol/L (ref 98–111)
Creatinine, Ser: 0.87 mg/dL (ref 0.44–1.00)
GFR calc Af Amer: >60 mL/min (ref >60)
GFR calc non Af Amer: >60 mL/min (ref >60)
Glucose, Bld: 166 mg/dL — ABNORMAL HIGH (ref 70–99)
Potassium: 4.5 mmol/L (ref 3.5–5.1)
Sodium: 138 mmol/L (ref 135–145)

## 2018-03-05 LAB — MRSA PCR SCREENING: MRSA by PCR: NEGATIVE

## 2018-03-05 LAB — GLUCOSE, CAPILLARY: Glucose-Capillary: 127 mg/dL — ABNORMAL HIGH (ref 70–99)

## 2018-03-05 LAB — ABO/RH: ABO/RH(D): B POS

## 2018-03-05 LAB — PROTIME-INR
INR: 1.02
Prothrombin Time: 13.3 seconds (ref 11.4–15.2)

## 2018-03-05 MED ORDER — INSULIN ASPART 100 UNIT/ML ~~LOC~~ SOLN
0.0000 [IU] | Freq: Three times a day (TID) | SUBCUTANEOUS | Status: DC
  Administered 2018-03-07: 2 [IU] via SUBCUTANEOUS
  Administered 2018-03-07: 3 [IU] via SUBCUTANEOUS
  Administered 2018-03-07 – 2018-03-08 (×2): 2 [IU] via SUBCUTANEOUS
  Administered 2018-03-08: 5 [IU] via SUBCUTANEOUS

## 2018-03-05 MED ORDER — MORPHINE SULFATE (PF) 4 MG/ML IV SOLN
4.0000 mg | INTRAVENOUS | Status: DC | PRN
  Administered 2018-03-05 – 2018-03-06 (×4): 4 mg via INTRAVENOUS
  Filled 2018-03-05 (×4): qty 1

## 2018-03-05 MED ORDER — MORPHINE SULFATE (PF) 2 MG/ML IV SOLN: 2.0000 mg | INTRAVENOUS | Status: DC | PRN

## 2018-03-05 MED ORDER — TRANEXAMIC ACID 1000 MG/10ML IV SOLN
2000.0000 mg | INTRAVENOUS | Status: DC
  Filled 2018-03-05: qty 20

## 2018-03-05 MED ORDER — ACETAMINOPHEN 650 MG RE SUPP: 650.0000 mg | Freq: Four times a day (QID) | RECTAL | Status: DC | PRN

## 2018-03-05 MED ORDER — SODIUM CHLORIDE 0.9 % IV SOLN
INTRAVENOUS | Status: DC
  Administered 2018-03-05 – 2018-03-06 (×3): via INTRAVENOUS

## 2018-03-05 MED ORDER — TETANUS-DIPHTH-ACELL PERTUSSIS 5-2.5-18.5 LF-MCG/0.5 IM SUSP
0.5000 mL | Freq: Once | INTRAMUSCULAR | Status: AC
  Administered 2018-03-05: 0.5 mL via INTRAMUSCULAR
  Filled 2018-03-05: qty 0.5

## 2018-03-05 MED ORDER — ACETAMINOPHEN 325 MG PO TABS: 650.0000 mg | ORAL_TABLET | Freq: Four times a day (QID) | ORAL | Status: DC | PRN

## 2018-03-05 MED ORDER — INSULIN ASPART 100 UNIT/ML ~~LOC~~ SOLN: 0.0000 [IU] | Freq: Every day | SUBCUTANEOUS | Status: DC

## 2018-03-05 MED ORDER — POVIDONE-IODINE 10 % EX SWAB: 2.0000 "application " | Freq: Once | CUTANEOUS | Status: DC

## 2018-03-05 MED ORDER — CEFAZOLIN SODIUM-DEXTROSE 2-4 GM/100ML-% IV SOLN
2.0000 g | INTRAVENOUS | Status: AC
  Administered 2018-03-06: 3 g via INTRAVENOUS
  Filled 2018-03-05 (×2): qty 100

## 2018-03-05 MED ORDER — TRANEXAMIC ACID-NACL 1000-0.7 MG/100ML-% IV SOLN
1000.0000 mg | INTRAVENOUS | Status: AC
  Administered 2018-03-06: 1000 mg via INTRAVENOUS
  Filled 2018-03-05: qty 100

## 2018-03-05 MED ORDER — MORPHINE SULFATE (PF) 4 MG/ML IV SOLN: 4.0000 mg | INTRAVENOUS | Status: DC | PRN

## 2018-03-05 MED ORDER — INFLUENZA VAC SPLIT HIGH-DOSE 0.5 ML IM SUSY
0.5000 mL | PREFILLED_SYRINGE | INTRAMUSCULAR | Status: DC
  Filled 2018-03-05: qty 0.5

## 2018-03-05 MED ORDER — MORPHINE SULFATE (PF) 4 MG/ML IV SOLN
4.0000 mg | INTRAVENOUS | Status: DC | PRN
  Administered 2018-03-05: 4 mg via INTRAVENOUS
  Filled 2018-03-05: qty 1

## 2018-03-05 NOTE — ED Triage Notes (Addendum)
PT fell off a 2 step onto LT hip. PT had shorten and rotation of hip on RT. Pedal pulses present. Pt received 169mc/ Fentanyl IV from EMS

## 2018-03-05 NOTE — ED Notes (Signed)
Patient transported to X-ray 

## 2018-03-05 NOTE — ED Provider Notes (Signed)
[facility] EMERGENCY DEPARTMENT Provider Note   CSN: [id number] Arrival date & time: 03/05/18  1434    History   Chief Complaint Chief Complaint  Patient presents with  . Leg Injury    HPI Stephanie Coleman is a 73 y.o. female with a past medical history of DM, obesity, hyperlipidemia, who presents today for evaluation of left hip pain.  She reports that she was taking her trash out when she stepped off a 2 foot platform landing mostly on her left foot.  She reports immediate onset of pain in her left hip.  She did not strike her head or pass out.  Denies any headache or neck pain.  She has been unable to bear weight since.  She was given 100 MCG fentanyl by EMS prior to arrival.  She denies any new numbness or tingling.  No new back pain.  She reports that her left ankle feels "funny".  She also complains of cuts from glass on her left hand.  Is unsure when her last tetanus shot was.     HPI  Past Medical History:  Diagnosis Date  . DDD (degenerative disc disease), lumbar   . Depression   . Diabetes mellitus without complication (HCC)    diet controlled  . Hyperlipidemia    pt states borderline  . Obesity     Patient Active Problem List   Diagnosis Date Noted  . Closed displaced fracture of left femoral neck ([place]) 03/05/2018  . Closed left hip fracture, initial encounter ([place]) 03/05/2018  . Intra-abdominal abscess ([place]) 01/30/2012  . Intra-abdominal fluid collection 01/29/2012    Past Surgical History:  Procedure Laterality Date  . BACK SURGERY    . CHOLECYSTECTOMY    . colorectal surgery       OB History   No obstetric history on file.      Home Medications    Prior to Admission medications   Medication Sig Start Date End Date Taking? Authorizing Provider  Aspirin-Caffeine (BAYER BACK & BODY PAIN EX ST) 500-32.5 MG TABS Take 2 tablets by mouth 2 (two) times daily as needed. For pain    [provider]  betamethasone dipropionate  (DIPROLENE) 0.05 % cream Apply topically 2 (two) times daily. 09/02/17   Saguier, Percell Miller, PA-C  estradiol (ESTRACE) 0.5 MG tablet Take 0.5 mg by mouth daily.    [provider]  medroxyPROGESTERone (PROVERA) 2.5 MG tablet Take 2.5 mg by mouth daily.    [provider]  metFORMIN (GLUCOPHAGE) 500 MG tablet Take 1 tablet (500 mg total) by mouth 2 (two) times daily with a meal. 11/11/17   Saguier, Percell Miller, PA-C    Family History No family history on file.  Social History Social History   Tobacco Use  . Smoking status: Never Smoker  . Smokeless tobacco: Never Used  Substance Use Topics  . Alcohol use: No  . Drug use: No     Allergies   Drisdol [ergocalciferol] and Food   Review of Systems Review of Systems  Constitutional: Negative for chills and fever.  HENT: Negative for congestion and facial swelling.   Eyes: Negative for visual disturbance.  Respiratory: Negative for chest tightness and shortness of breath.   Cardiovascular: Negative for chest pain.  Gastrointestinal: Negative for abdominal pain, nausea and vomiting.  Musculoskeletal: Negative for back pain and neck pain.       Left hip pain  Skin: Positive for wound.  Neurological: Negative for dizziness, weakness and headaches.  Psychiatric/Behavioral: Negative for confusion.  All other systems reviewed and are negative.    Physical Exam Updated Vital Signs BP 134/70 (BP Location: Right Arm)   Pulse 99   Temp 97.7 F (36.5 C) (Oral)   Resp 18   SpO2 96%   Physical Exam Vitals signs and nursing note reviewed.  Constitutional:      General: She is not in acute distress.    Appearance: She is well-developed.  HENT:     Head: Normocephalic and atraumatic.  Eyes:     Conjunctiva/sclera: Conjunctivae normal.  Neck:     Musculoskeletal: Normal range of motion and neck supple. No muscular tenderness.  Cardiovascular:     Rate and Rhythm: Normal rate and regular rhythm.     Pulses: Normal  pulses.     Heart sounds: Normal heart sounds. No murmur.     Comments: 2+ DP/PT pulses bilaterally.  Pulmonary:     Effort: Pulmonary effort is normal. No respiratory distress.     Breath sounds: Normal breath sounds.  Abdominal:     General: Abdomen is flat. There is no distension.     Palpations: Abdomen is soft. There is no mass.     Tenderness: There is no abdominal tenderness.     Hernia: No hernia is present.  Musculoskeletal:     Comments: Her left leg is shortened, externally rotated.  There is diffuse tenderness to palpation over left hip.  She has full pain-free range of motion of left ankle however reports that it feels "funny."  No crepitus or deformities palpated over left knee or ankle.  Compartments in left leg are soft, easily compressible. There is localized tenderness to palpation over the left thenar eminence.  No crepitus or deformities palpated, she is able to make a fist.  Skin:    General: Skin is warm and dry.     Comments: Multiple superficial lacerations present on left hand.  Neurological:     General: No focal deficit present.     Mental Status: She is alert. Mental status is at baseline.     Cranial Nerves: No cranial nerve deficit.  Psychiatric:        Mood and Affect: Mood normal.        Behavior: Behavior normal.      ED Treatments / Results  Labs (all labs ordered are listed, but only abnormal results are displayed) Labs Reviewed  BASIC METABOLIC PANEL - Abnormal; Notable for the following components:      Result Value   Glucose, Bld 166 (*)    All other components within normal limits  CBC WITH DIFFERENTIAL/PLATELET - Abnormal; Notable for the following components:   Hemoglobin 9.0 (*)    HCT 31.8 (*)    MCV 70.2 (*)    MCH 19.9 (*)    MCHC 28.3 (*)    RDW 18.6 (*)    Neutro Abs 7.8 (*)    All other components within normal limits  PROTIME-INR  TYPE AND SCREEN  ABO/RH    EKG EKG Interpretation  Date/Time:  Sunday March 05 2018  17:46:49 EST Ventricular Rate:  98 PR Interval:    QRS Duration: 88 QT Interval:  354 QTC Calculation: 452 R Axis:   34 Text Interpretation:  Sinus rhythm Low voltage, precordial leads RSR' in V1 or V2, probably normal variant No old tracing to compare Confirmed by Isla Pence [phone]) on 03/05/2018 5:52:18 PM   Radiology Dg Tibia/fibula Left  Result Date: 03/05/2018 CLINICAL DATA:  Fall, left hip pain EXAM: LEFT TIBIA AND FIBULA - 2 VIEW COMPARISON:  None. FINDINGS: There is no evidence of fracture or other focal bone lesions. Soft tissues are unremarkable. IMPRESSION: Negative. Electronically Signed   By: Rolm Baptise M.D.   On: 03/05/2018 16:23   Dg Hand Complete Left  Result Date: 03/05/2018 CLINICAL DATA:  Fall EXAM: LEFT HAND - COMPLETE 3+ VIEW COMPARISON:  None. FINDINGS: Degenerative changes at the 1st carpometacarpal joint. No acute bony abnormality. Specifically, no fracture, subluxation, or dislocation. IMPRESSION: No acute bony abnormality. Electronically Signed   By: Rolm Baptise M.D.   On: 03/05/2018 16:22   Dg Hip Unilat With Pelvis 2-3 Views Left  Result Date: 03/05/2018 CLINICAL DATA:  Fall, left hip pain EXAM: DG HIP (WITH OR WITHOUT PELVIS) 2-3V LEFT COMPARISON:  None. FINDINGS: There is a left femoral neck fracture with varus angulation. No subluxation or dislocation. No additional acute bony abnormality. IMPRESSION: Left femoral neck fracture with varus angulation. Electronically Signed   By: Rolm Baptise M.D.   On: 03/05/2018 16:21    Procedures Procedures (including critical care time)  Medications Ordered in ED Medications  morphine 4 MG/ML injection 4 mg (4 mg Intravenous Given 03/05/18 1646)  Tdap (BOOSTRIX) injection 0.5 mL (0.5 mLs Intramuscular Given 03/05/18 1646)     Initial Impression / Assessment and Plan / ED Course  I have reviewed the triage vital signs and the nursing notes.  Pertinent labs & imaging results that were available during my care  of the patient were reviewed by me and considered in my medical decision making (see chart for details).  Clinical Course as of Mar 06 1807  Nancy Fetter Mar 05, 2018  1629 Patient reevaluated, informed of her results.  She does not have a local orthopedist.  She reports that her pain medicine from EMS has worn off.  Morphine was ordered   [EH]  [place] with Dr. Erlinda Hong of orthopedics who requests medical admission, will take her to operating room tomorrow, n.p.o. after midnight.   [EH]  1747 Spoke with hospitalist    [EH]    Clinical Course User Index [EH] Lorin Glass, PA-C      Patient presents today for evaluation after a mechanical fall causing her to land on her left leg.  On exam her left leg was externally rotated and shortened.  She is neurovascularly intact to her left leg.  She did not strike her head or pass out.  Denies any head pain or neck pain.  Aside from pain in her left thumb and superficial lacerations over her left hand denies any other injuries from this fall.  X-rays were obtained showing a left femoral neck fracture.  X-rays of left hand did not show evidence of fracture or dislocation.  She is unsure when her last tetanus shot was, therefore this was updated.  Her pain was treated with morphine.  The wounds on her left hand did not require suturing.    Labs are obtained, patient's hemoglobin is low at 9.0, chart review shows that approximately 6 months ago her hemoglobin was 9.9 so I suspect that this is mostly chronic anemia rather than acute.  Her glucose was slightly elevated at 166, PT/INR is normal.    I spoke with Dr.Xu on-call for orthopedics who recommends medical admission, will take her to the operating room tomorrow.  I spoke with Dr. Nehemiah Settle who will admit patient.  This patient was seen as a shared visit with Dr.  Haviland.   Final Clinical Impressions(s) / ED Diagnoses   Final diagnoses:  Closed fracture of left hip, initial encounter [facility])  Fall, initial  encounter    ED Discharge Orders    None       Ollen Gross 03/05/18 1813    Isla Pence, MD 03/05/18 [phone]

## 2018-03-05 NOTE — Plan of Care (Signed)
  Problem: Activity: Goal: Ability to ambulate and perform ADLs will improve Outcome: Progressing   Problem: Clinical Measurements: Goal: Postoperative complications will be avoided or minimized Outcome: Progressing   Problem: Pain Management: Goal: Pain level will decrease Outcome: Progressing   

## 2018-03-05 NOTE — Consult Note (Signed)
ORTHOPAEDIC CONSULTATION  REQUESTING PHYSICIAN: Truett Mainland, DO  Chief Complaint: Left femoral neck fracture  HPI: Stephanie Coleman is a 73 y.o. female who presents with left femoral neck fracture after she fell while taking out her trash.  She turned around and took a mis-step and fell.  She denies LOC, neck pain, abd pain.  She has severe left hip pain and inability to ambulate.  She lives alone and is independent with ADLs.  She is diabetic and A1c 3 months ago was 7.7.    Past Medical History:  Diagnosis Date  . DDD (degenerative disc disease), lumbar   . Depression   . Diabetes mellitus without complication (HCC)    diet controlled  . Hyperlipidemia    pt states borderline  . Obesity    Past Surgical History:  Procedure Laterality Date  . BACK SURGERY    . CHOLECYSTECTOMY    . colorectal surgery     Social History   Socioeconomic History  . Marital status: Divorced    Spouse name: Not on file  . Number of children: Not on file  . Years of education: Not on file  . Highest education level: Not on file  Occupational History  . Not on file  Social Needs  . Financial resource strain: Not on file  . Food insecurity:    Worry: Not on file    Inability: Not on file  . Transportation needs:    Medical: Not on file    Non-medical: Not on file  Tobacco Use  . Smoking status: Never Smoker  . Smokeless tobacco: Never Used  Substance and Sexual Activity  . Alcohol use: No  . Drug use: No  . Sexual activity: Never  Lifestyle  . Physical activity:    Days per week: Not on file    Minutes per session: Not on file  . Stress: Not on file  Relationships  . Social connections:    Talks on phone: Not on file    Gets together: Not on file    Attends religious service: Not on file    Active member of club or organization: Not on file    Attends meetings of clubs or organizations: Not on file    Relationship status: Not on file  Other Topics Concern  . Not on file    Social History Narrative  . Not on file   No family history on file. - negative except otherwise stated in the family history section Allergies  Allergen Reactions  . Drisdol [Ergocalciferol] Rash  . Food Other (See Comments)    Gluten causes extreme intestinal distress    Prior to Admission medications   Medication Sig Start Date End Date Taking? Authorizing Provider  Aspirin-Caffeine (BAYER BACK & BODY PAIN EX ST) 500-32.5 MG TABS Take 2 tablets by mouth 2 (two) times daily as needed. For pain    [provider]  betamethasone dipropionate (DIPROLENE) 0.05 % cream Apply topically 2 (two) times daily. 09/02/17   Saguier, Percell Miller, PA-C  estradiol (ESTRACE) 0.5 MG tablet Take 0.5 mg by mouth daily.    [provider]  medroxyPROGESTERone (PROVERA) 2.5 MG tablet Take 2.5 mg by mouth daily.    [provider]  metFORMIN (GLUCOPHAGE) 500 MG tablet Take 1 tablet (500 mg total) by mouth 2 (two) times daily with a meal. 11/11/17   Mackie Pai, PA-C   Dg Tibia/fibula Left  Result Date: 03/05/2018 CLINICAL DATA:  Fall, left hip pain EXAM:  LEFT TIBIA AND FIBULA - 2 VIEW COMPARISON:  None. FINDINGS: There is no evidence of fracture or other focal bone lesions. Soft tissues are unremarkable. IMPRESSION: Negative. Electronically Signed   By: Rolm Baptise M.D.   On: 03/05/2018 16:23   Dg Hand Complete Left  Result Date: 03/05/2018 CLINICAL DATA:  Fall EXAM: LEFT HAND - COMPLETE 3+ VIEW COMPARISON:  None. FINDINGS: Degenerative changes at the 1st carpometacarpal joint. No acute bony abnormality. Specifically, no fracture, subluxation, or dislocation. IMPRESSION: No acute bony abnormality. Electronically Signed   By: Rolm Baptise M.D.   On: 03/05/2018 16:22   Dg Hip Unilat With Pelvis 2-3 Views Left  Result Date: 03/05/2018 CLINICAL DATA:  Fall, left hip pain EXAM: DG HIP (WITH OR WITHOUT PELVIS) 2-3V LEFT COMPARISON:  None. FINDINGS: There is a left femoral neck fracture  with varus angulation. No subluxation or dislocation. No additional acute bony abnormality. IMPRESSION: Left femoral neck fracture with varus angulation. Electronically Signed   By: Rolm Baptise M.D.   On: 03/05/2018 16:21   - pertinent xrays, CT, MRI studies were reviewed and independently interpreted  Positive ROS: All other systems have been reviewed and were otherwise negative with the exception of those mentioned in the HPI and as above.  Physical Exam: General: Alert, no acute distress Cardiovascular: No pedal edema Respiratory: No cyanosis, no use of accessory musculature GI: No organomegaly, abdomen is soft and non-tender Skin: No lesions in the area of chief complaint Neurologic: Sensation intact distally Psychiatric: Patient is competent for consent with normal mood and affect Lymphatic: No axillary or cervical lymphadenopathy  MUSCULOSKELETAL:  - NVI LLE - severe pain with any movement of LLE  Assessment: Displaced left femoral neck fracture  Plan: - admit to hospitalist, NPO after midnight, hold chemoprophylaxis for DVT in anticipation for surgery, SCDs for now - informed consent obtained from patient - will plan for left total hip replacement tomorrow - anticipate needing SNF post hospital - foley catheter  Thank you for the consult and the opportunity to see [name]  N. Eduard Roux, MD [place] 5:58 PM

## 2018-03-05 NOTE — H&P (Signed)
History and Physical  Stephanie Coleman SJG:[id number] DOB: 01-01-1946 DOA: 03/05/2018  Referring physician: Wyn Quaker, PA-C , ED provider PCP: Patient, No Pcp Per  Outpatient Specialists: none  Patient Coming From: home  Chief Complaint: left hip pain  HPI: Stephanie Coleman is a 73 y.o. female with a history of diet-controlled diabetes, hyperlipidemia.  Patient seen for pain in the left hip after a mechanical fall.  She stepped off a 2 foot platform landing mostly on her left foot and had immediate pain in the left hip.  Denies syncopal episode or head contusion.  No headache or neck pain.  She has been unable to bear weight and was brought by EMS to the hospital for evaluation.  Pain is worse with movement and improved with rest.  No other palliating or provoking factors.  Symptoms are constant.  Emergency Department Course: X-ray shows left femoral neck facture with varus angulation  Review of Systems:    Pt denies any fevers, chills, nausea, vomiting, diarrhea, constipation, abdominal pain, shortness of breath, dyspnea on exertion, orthopnea, cough, wheezing, palpitations, headache, vision changes, lightheadedness, dizziness, melena, rectal bleeding.  Review of systems are otherwise negative  Past Medical History:  Diagnosis Date  . DDD (degenerative disc disease), lumbar   . Depression   . Diabetes mellitus without complication (HCC)    diet controlled  . Hyperlipidemia    pt states borderline  . Obesity    Past Surgical History:  Procedure Laterality Date  . BACK SURGERY    . CHOLECYSTECTOMY    . colorectal surgery     Social History:  reports that she has never smoked. She has never used smokeless tobacco. She reports that she does not drink alcohol or use drugs. Patient lives at home  Allergies  Allergen Reactions  . Drisdol [Ergocalciferol] Rash  . Food Other (See Comments)    Gluten causes extreme intestinal distress     No family history on file.  Family history of  diabetes and hypertension.  Prior to Admission medications   Medication Sig Start Date End Date Taking? Authorizing Provider  Aspirin-Caffeine (BAYER BACK & BODY PAIN EX ST) 500-32.5 MG TABS Take 2 tablets by mouth 2 (two) times daily as needed. For pain    [provider]  betamethasone dipropionate (DIPROLENE) 0.05 % cream Apply topically 2 (two) times daily. 09/02/17   Saguier, Percell Miller, PA-C  estradiol (ESTRACE) 0.5 MG tablet Take 0.5 mg by mouth daily.    [provider]  medroxyPROGESTERone (PROVERA) 2.5 MG tablet Take 2.5 mg by mouth daily.    [provider]  metFORMIN (GLUCOPHAGE) 500 MG tablet Take 1 tablet (500 mg total) by mouth 2 (two) times daily with a meal. 11/11/17   Saguier, Percell Miller, PA-C    Physical Exam: BP 134/70 (BP Location: Right Arm)   Pulse 99   Temp 97.7 F (36.5 C) (Oral)   Resp 18   SpO2 96%   . General: Elderly Caucasian female. Awake and alert and oriented x3. No acute cardiopulmonary distress.  Marland Kitchen HEENT: Normocephalic atraumatic.  Right and left ears normal in appearance.  Pupils equal, round, reactive to light. Extraocular muscles are intact. Sclerae anicteric and noninjected.  Moist mucosal membranes. No mucosal lesions.  . Neck: Neck supple without lymphadenopathy. No carotid bruits. No masses palpated.  . Cardiovascular: Regular rate with normal S1-S2 sounds. No murmurs, rubs, gallops auscultated. No JVD.  Marland Kitchen Respiratory: Good respiratory effort with no wheezes, rales, rhonchi. Lungs clear to auscultation  bilaterally.  No accessory muscle use. . Abdomen: Soft, nontender, nondistended. Active bowel sounds. No masses or hepatosplenomegaly  . Skin: No rashes, lesions, or ulcerations.  Dry, warm to touch. 2+ dorsalis pedis and radial pulses. . Musculoskeletal: Left leg shortened and externally rotated.  No calf or leg pain. All major joints not erythematous nontender.  No upper or lower joint deformation.  Good ROM.  No contractures   . Psychiatric: Intact judgment and insight. Pleasant and cooperative. . Neurologic: No focal neurological deficits. Strength is 5/5 and symmetric in upper and lower extremities.  Cranial nerves II through XII are grossly intact.           Labs on Admission: I have personally reviewed following labs and imaging studies  CBC: Recent Labs  Lab 03/05/18 1635  WBC 9.4  NEUTROABS 7.8*  HGB 9.0*  HCT 31.8*  MCV 70.2*  PLT 454   Basic Metabolic Panel: Recent Labs  Lab 03/05/18 1635  NA 138  K 4.5  CL 110  CO2 23  GLUCOSE 166*  BUN 11  CREATININE 0.87  CALCIUM 9.4   GFR: CrCl cannot be calculated (Unknown ideal weight.). Liver Function Tests: No results for input(s): AST, ALT, ALKPHOS, BILITOT, PROT, ALBUMIN in the last 168 hours. No results for input(s): LIPASE, AMYLASE in the last 168 hours. No results for input(s): AMMONIA in the last 168 hours. Coagulation Profile: Recent Labs  Lab 03/05/18 1635  INR 1.02   Cardiac Enzymes: No results for input(s): CKTOTAL, CKMB, CKMBINDEX, TROPONINI in the last 168 hours. BNP (last 3 results) No results for input(s): PROBNP in the last 8760 hours. HbA1C: No results for input(s): HGBA1C in the last 72 hours. CBG: No results for input(s): GLUCAP in the last 168 hours. Lipid Profile: No results for input(s): CHOL, HDL, LDLCALC, TRIG, CHOLHDL, LDLDIRECT in the last 72 hours. Thyroid Function Tests: No results for input(s): TSH, T4TOTAL, FREET4, T3FREE, THYROIDAB in the last 72 hours. Anemia Panel: No results for input(s): VITAMINB12, FOLATE, FERRITIN, TIBC, IRON, RETICCTPCT in the last 72 hours. Urine analysis:    Component Value Date/Time   COLORURINE YELLOW 01/29/2012 1447   APPEARANCEUR CLOUDY (A) 01/29/2012 1447   LABSPEC 1.010 01/29/2012 1447   PHURINE 5.5 01/29/2012 1447   GLUCOSEU NEGATIVE 01/29/2012 1447   HGBUR NEGATIVE 01/29/2012 1447   BILIRUBINUR NEGATIVE 01/29/2012 1447   KETONESUR NEGATIVE 01/29/2012 1447    PROTEINUR NEGATIVE 01/29/2012 1447   UROBILINOGEN 0.2 01/29/2012 1447   NITRITE NEGATIVE 01/29/2012 1447   LEUKOCYTESUR NEGATIVE 01/29/2012 1447   Sepsis Labs: @LABRCNTIP (procalcitonin:4,lacticidven:4) )No results found for this or any previous visit (from the past 240 hour(s)).   Radiological Exams on Admission: Dg Tibia/fibula Left  Result Date: 03/05/2018 CLINICAL DATA:  Fall, left hip pain EXAM: LEFT TIBIA AND FIBULA - 2 VIEW COMPARISON:  None. FINDINGS: There is no evidence of fracture or other focal bone lesions. Soft tissues are unremarkable. IMPRESSION: Negative. Electronically Signed   By: Rolm Baptise M.D.   On: 03/05/2018 16:23   Dg Hand Complete Left  Result Date: 03/05/2018 CLINICAL DATA:  Fall EXAM: LEFT HAND - COMPLETE 3+ VIEW COMPARISON:  None. FINDINGS: Degenerative changes at the 1st carpometacarpal joint. No acute bony abnormality. Specifically, no fracture, subluxation, or dislocation. IMPRESSION: No acute bony abnormality. Electronically Signed   By: Rolm Baptise M.D.   On: 03/05/2018 16:22   Dg Hip Unilat With Pelvis 2-3 Views Left  Result Date: 03/05/2018 CLINICAL DATA:  Fall, left hip pain EXAM:  DG HIP (WITH OR WITHOUT PELVIS) 2-3V LEFT COMPARISON:  None. FINDINGS: There is a left femoral neck fracture with varus angulation. No subluxation or dislocation. No additional acute bony abnormality. IMPRESSION: Left femoral neck fracture with varus angulation. Electronically Signed   By: Rolm Baptise M.D.   On: 03/05/2018 16:21    EKG: Independently reviewed.  Sinus rhythm with low voltage  Assessment/Plan: Principal Problem:   Closed displaced fracture of left femoral neck (HCC) Active Problems:   Diet-controlled diabetes mellitus ([place])    This patient was discussed with the ED physician, including pertinent vitals, physical exam findings, labs, and imaging.  We also discussed care given by the ED provider.  1. Closed displaced fracture of left femoral  neck a. Admit b. Surgery tomorrow c. Diabetic diet until midnight, then n.p.o. d. Morphine for pain 2. Diet-controlled diabetes a. Diet controlled b. 6 CBGs before meals and nightly with sliding scale insulin  DVT prophylaxis: SCDs Consultants: Ortho Code Status: Full code Family Communication: None Disposition Plan: Pending   Truett Mainland, DO

## 2018-03-06 ENCOUNTER — Inpatient Hospital Stay (HOSPITAL_COMMUNITY): Payer: Medicare HMO | Admitting: Certified Registered Nurse Anesthetist

## 2018-03-06 ENCOUNTER — Inpatient Hospital Stay (HOSPITAL_COMMUNITY): Payer: Medicare HMO

## 2018-03-06 ENCOUNTER — Other Ambulatory Visit: Payer: Self-pay

## 2018-03-06 ENCOUNTER — Encounter (HOSPITAL_COMMUNITY)
Admission: EM | Disposition: A | Discharge status: Home Health | Payer: Self-pay | Source: Home / Self Care | Attending: Internal Medicine

## 2018-03-06 ENCOUNTER — Encounter (HOSPITAL_COMMUNITY): Payer: Self-pay

## 2018-03-06 DIAGNOSIS — E119 Type 2 diabetes mellitus without complications: Secondary | ICD-10-CM

## 2018-03-06 HISTORY — PX: TOTAL HIP ARTHROPLASTY: SHX124

## 2018-03-06 LAB — BASIC METABOLIC PANEL
Anion gap: 10 (ref 5–15)
BUN: 9 mg/dL (ref 8–23)
CO2: 20 mmol/L — ABNORMAL LOW (ref 22–32)
CREATININE: 0.7 mg/dL (ref 0.44–1.00)
Calcium: 9 mg/dL (ref 8.9–10.3)
Chloride: 109 mmol/L (ref 98–111)
GFR calc non Af Amer: >60 mL/min (ref >60)
GLUCOSE: 141 mg/dL — AB (ref 70–99)
Potassium: 3.8 mmol/L (ref 3.5–5.1)
SODIUM: 139 mmol/L (ref 135–145)

## 2018-03-06 LAB — CBC
HCT: 30.3 % — ABNORMAL LOW (ref 36.0–46.0)
Hemoglobin: 8.5 g/dL — ABNORMAL LOW (ref 12.0–15.0)
MCH: 19.1 pg — ABNORMAL LOW (ref 26.0–34.0)
MCHC: 28.1 g/dL — ABNORMAL LOW (ref 30.0–36.0)
MCV: 68.2 fL — ABNORMAL LOW (ref 80.0–100.0)
Platelets: 268 10*3/uL (ref 150–400)
RBC: 4.44 MIL/uL (ref 3.87–5.11)
RDW: 18.5 % — ABNORMAL HIGH (ref 11.5–15.5)
WBC: 6.9 10*3/uL (ref 4.0–10.5)
nRBC: 0 % (ref 0.0–0.2)

## 2018-03-06 LAB — GLUCOSE, CAPILLARY
GLUCOSE-CAPILLARY: 116 mg/dL — AB (ref 70–99)
Glucose-Capillary: 104 mg/dL — ABNORMAL HIGH (ref 70–99)
Glucose-Capillary: 103 mg/dL — ABNORMAL HIGH (ref 70–99)
Glucose-Capillary: 122 mg/dL — ABNORMAL HIGH (ref 70–99)
Glucose-Capillary: 147 mg/dL — ABNORMAL HIGH (ref 70–99)

## 2018-03-06 LAB — POCT I-STAT 4, (NA,K, GLUC, HGB,HCT)
Glucose, Bld: 144 mg/dL — ABNORMAL HIGH (ref 70–99)
HCT: 26 % — ABNORMAL LOW (ref 36.0–46.0)
Hemoglobin: 8.8 g/dL — ABNORMAL LOW (ref 12.0–15.0)
Potassium: 3.8 mmol/L (ref 3.5–5.1)
Sodium: 138 mmol/L (ref 135–145)

## 2018-03-06 SURGERY — ARTHROPLASTY, HIP, TOTAL, ANTERIOR APPROACH
Anesthesia: Monitor Anesthesia Care | Site: Hip | Laterality: Left

## 2018-03-06 MED ORDER — PHENOL 1.4 % MT LIQD: 1.0000 | OROMUCOSAL | Status: DC | PRN

## 2018-03-06 MED ORDER — METHOCARBAMOL 500 MG PO TABS
ORAL_TABLET | ORAL | Status: AC
  Filled 2018-03-06: qty 1

## 2018-03-06 MED ORDER — VANCOMYCIN HCL 1 G IV SOLR
INTRAVENOUS | Status: DC | PRN
  Administered 2018-03-06: 1000 mg via TOPICAL

## 2018-03-06 MED ORDER — HYDROCODONE-ACETAMINOPHEN 5-325 MG PO TABS: 1.0000 | ORAL_TABLET | ORAL | Status: DC | PRN

## 2018-03-06 MED ORDER — MAGNESIUM CITRATE PO SOLN: 1.0000 | Freq: Once | ORAL | Status: DC | PRN

## 2018-03-06 MED ORDER — ONDANSETRON HCL 4 MG/2ML IJ SOLN
4.0000 mg | Freq: Once | INTRAMUSCULAR | Status: AC | PRN
  Administered 2018-03-06: 4 mg via INTRAVENOUS

## 2018-03-06 MED ORDER — ACETAMINOPHEN 10 MG/ML IV SOLN
INTRAVENOUS | Status: AC
  Filled 2018-03-06: qty 100

## 2018-03-06 MED ORDER — PROPOFOL 500 MG/50ML IV EMUL
INTRAVENOUS | Status: DC | PRN
  Administered 2018-03-06: 50 ug/kg/min via INTRAVENOUS

## 2018-03-06 MED ORDER — ACETAMINOPHEN 500 MG PO TABS
500.0000 mg | ORAL_TABLET | Freq: Four times a day (QID) | ORAL | Status: AC
  Administered 2018-03-07 (×4): 500 mg via ORAL
  Filled 2018-03-06 (×4): qty 1

## 2018-03-06 MED ORDER — ALBUMIN HUMAN 5 % IV SOLN
INTRAVENOUS | Status: DC | PRN
  Administered 2018-03-06: 17:00:00 via INTRAVENOUS

## 2018-03-06 MED ORDER — FENTANYL CITRATE (PF) 100 MCG/2ML IJ SOLN
INTRAMUSCULAR | Status: DC | PRN
  Administered 2018-03-06: 50 ug via INTRAVENOUS

## 2018-03-06 MED ORDER — HYDROCODONE-ACETAMINOPHEN 7.5-325 MG PO TABS
1.0000 | ORAL_TABLET | ORAL | Status: DC | PRN
  Administered 2018-03-06: 1 via ORAL
  Administered 2018-03-06: 2 via ORAL
  Filled 2018-03-06: qty 2

## 2018-03-06 MED ORDER — LACTATED RINGERS IV SOLN
INTRAVENOUS | Status: DC
  Administered 2018-03-06 (×2): via INTRAVENOUS

## 2018-03-06 MED ORDER — ONDANSETRON HCL 4 MG/2ML IJ SOLN: 4.0000 mg | Freq: Four times a day (QID) | INTRAMUSCULAR | Status: DC | PRN

## 2018-03-06 MED ORDER — LIDOCAINE 2% (20 MG/ML) 5 ML SYRINGE
INTRAMUSCULAR | Status: AC
  Filled 2018-03-06: qty 10

## 2018-03-06 MED ORDER — HYDROCODONE-ACETAMINOPHEN 7.5-325 MG PO TABS
ORAL_TABLET | ORAL | Status: AC
  Filled 2018-03-06: qty 1

## 2018-03-06 MED ORDER — MORPHINE SULFATE (PF) 2 MG/ML IV SOLN: 1.0000 mg | INTRAVENOUS | Status: DC | PRN

## 2018-03-06 MED ORDER — PROPOFOL 10 MG/ML IV BOLUS
INTRAVENOUS | Status: AC
  Filled 2018-03-06: qty 20

## 2018-03-06 MED ORDER — SULFAMETHOXAZOLE-TRIMETHOPRIM 800-160 MG PO TABS: 1.0000 | ORAL_TABLET | Freq: Two times a day (BID) | ORAL | 0 refills | Status: DC

## 2018-03-06 MED ORDER — ENOXAPARIN SODIUM 40 MG/0.4ML ~~LOC~~ SOLN: 40.0000 mg | Freq: Every day | SUBCUTANEOUS | 0 refills | Status: DC

## 2018-03-06 MED ORDER — TRANEXAMIC ACID 1000 MG/10ML IV SOLN
INTRAVENOUS | Status: DC | PRN
  Administered 2018-03-06: 2000 mg via TOPICAL

## 2018-03-06 MED ORDER — SORBITOL 70 % SOLN: 30.0000 mL | Freq: Every day | Status: DC | PRN

## 2018-03-06 MED ORDER — BUPIVACAINE HCL (PF) 0.5 % IJ SOLN
INTRAMUSCULAR | Status: DC | PRN
  Administered 2018-03-06: 3 mL via INTRATHECAL

## 2018-03-06 MED ORDER — ACETAMINOPHEN 325 MG PO TABS: 325.0000 mg | ORAL_TABLET | Freq: Four times a day (QID) | ORAL | Status: DC | PRN

## 2018-03-06 MED ORDER — CEFAZOLIN SODIUM-DEXTROSE 2-4 GM/100ML-% IV SOLN
2.0000 g | Freq: Four times a day (QID) | INTRAVENOUS | Status: AC
  Administered 2018-03-06 – 2018-03-07 (×3): 2 g via INTRAVENOUS
  Filled 2018-03-06 (×3): qty 100

## 2018-03-06 MED ORDER — FENTANYL CITRATE (PF) 250 MCG/5ML IJ SOLN
INTRAMUSCULAR | Status: AC
  Filled 2018-03-06: qty 5

## 2018-03-06 MED ORDER — ONDANSETRON HCL 4 MG/2ML IJ SOLN
INTRAMUSCULAR | Status: AC
  Administered 2018-03-06: 4 mg via INTRAVENOUS
  Filled 2018-03-06: qty 2

## 2018-03-06 MED ORDER — ALUM & MAG HYDROXIDE-SIMETH 200-200-20 MG/5ML PO SUSP: 30.0000 mL | ORAL | Status: DC | PRN

## 2018-03-06 MED ORDER — ENOXAPARIN SODIUM 40 MG/0.4ML ~~LOC~~ SOLN
40.0000 mg | SUBCUTANEOUS | Status: DC
  Administered 2018-03-07 – 2018-03-08 (×2): 40 mg via SUBCUTANEOUS
  Filled 2018-03-06 (×2): qty 0.4

## 2018-03-06 MED ORDER — ONDANSETRON HCL 4 MG PO TABS: 4.0000 mg | ORAL_TABLET | Freq: Four times a day (QID) | ORAL | Status: DC | PRN

## 2018-03-06 MED ORDER — MENTHOL 3 MG MT LOZG: 1.0000 | LOZENGE | OROMUCOSAL | Status: DC | PRN

## 2018-03-06 MED ORDER — SODIUM CHLORIDE 0.9 % IV SOLN
INTRAVENOUS | Status: DC
  Administered 2018-03-06: 23:00:00 via INTRAVENOUS

## 2018-03-06 MED ORDER — POLYETHYLENE GLYCOL 3350 17 G PO PACK: 17.0000 g | PACK | Freq: Every day | ORAL | Status: DC | PRN

## 2018-03-06 MED ORDER — OXYCODONE HCL 5 MG/5ML PO SOLN: 5.0000 mg | Freq: Once | ORAL | Status: DC | PRN

## 2018-03-06 MED ORDER — SODIUM CHLORIDE 0.9 % IR SOLN
Status: DC | PRN
  Administered 2018-03-06: 3000 mL

## 2018-03-06 MED ORDER — OXYCODONE-ACETAMINOPHEN 5-325 MG PO TABS: 1.0000 | ORAL_TABLET | Freq: Four times a day (QID) | ORAL | 0 refills | Status: DC | PRN

## 2018-03-06 MED ORDER — OXYCODONE HCL 5 MG PO TABS: 5.0000 mg | ORAL_TABLET | Freq: Once | ORAL | Status: DC | PRN

## 2018-03-06 MED ORDER — SODIUM CHLORIDE 0.9 % IV SOLN
INTRAVENOUS | Status: DC | PRN
  Administered 2018-03-06: 25 ug/min via INTRAVENOUS

## 2018-03-06 MED ORDER — PROPOFOL 10 MG/ML IV BOLUS
INTRAVENOUS | Status: DC | PRN
  Administered 2018-03-06: 10 mg via INTRAVENOUS
  Administered 2018-03-06: 20 mg via INTRAVENOUS

## 2018-03-06 MED ORDER — METHOCARBAMOL 500 MG PO TABS
500.0000 mg | ORAL_TABLET | Freq: Four times a day (QID) | ORAL | Status: DC | PRN
  Administered 2018-03-06: 500 mg via ORAL

## 2018-03-06 MED ORDER — 0.9 % SODIUM CHLORIDE (POUR BTL) OPTIME
TOPICAL | Status: DC | PRN
  Administered 2018-03-06: 1000 mL

## 2018-03-06 MED ORDER — METHOCARBAMOL 1000 MG/10ML IJ SOLN
500.0000 mg | Freq: Four times a day (QID) | INTRAVENOUS | Status: DC | PRN
  Filled 2018-03-06: qty 5

## 2018-03-06 MED ORDER — FENTANYL CITRATE (PF) 100 MCG/2ML IJ SOLN: 25.0000 ug | INTRAMUSCULAR | Status: DC | PRN

## 2018-03-06 MED ORDER — DOCUSATE SODIUM 100 MG PO CAPS
100.0000 mg | ORAL_CAPSULE | Freq: Two times a day (BID) | ORAL | Status: DC
  Administered 2018-03-06 – 2018-03-08 (×4): 100 mg via ORAL
  Filled 2018-03-06 (×4): qty 1

## 2018-03-06 MED ORDER — VANCOMYCIN HCL 1000 MG IV SOLR
INTRAVENOUS | Status: AC
  Filled 2018-03-06: qty 1000

## 2018-03-06 SURGICAL SUPPLY — 61 items
BAG DECANTER FOR FLEXI CONT (MISCELLANEOUS) ×3 IMPLANT
CELLS DAT CNTRL 66122 CELL SVR (MISCELLANEOUS) IMPLANT
COVER PERINEAL POST (MISCELLANEOUS) ×3 IMPLANT
COVER SURGICAL LIGHT HANDLE (MISCELLANEOUS) ×3 IMPLANT
COVER WAND RF STERILE (DRAPES) ×3 IMPLANT
DRAPE C-ARM 42X72 X-RAY (DRAPES) ×3 IMPLANT
DRAPE POUCH INSTRU U-SHP 10X18 (DRAPES) ×3 IMPLANT
DRAPE STERI IOBAN 125X83 (DRAPES) ×3 IMPLANT
DRAPE U-SHAPE 47X51 STRL (DRAPES) ×6 IMPLANT
DRSG AQUACEL AG ADV 3.5X10 (GAUZE/BANDAGES/DRESSINGS) ×3 IMPLANT
ELECT BLADE 4.0 EZ CLEAN MEGAD (MISCELLANEOUS) ×3
ELECT REM PT RETURN 9FT ADLT (ELECTROSURGICAL) ×3
ELECTRODE BLDE 4.0 EZ CLN MEGD (MISCELLANEOUS) ×1 IMPLANT
ELECTRODE REM PT RTRN 9FT ADLT (ELECTROSURGICAL) ×1 IMPLANT
GAUZE XEROFORM 1X8 LF (GAUZE/BANDAGES/DRESSINGS) ×2 IMPLANT
GLOVE BIOGEL PI IND STRL 7.0 (GLOVE) ×1 IMPLANT
GLOVE BIOGEL PI INDICATOR 7.0 (GLOVE) ×2
GLOVE ECLIPSE 7.0 STRL STRAW (GLOVE) ×2 IMPLANT
GLOVE SKINSENSE NS SZ7.5 (GLOVE) ×2
GLOVE SKINSENSE STRL SZ7.5 (GLOVE) ×1 IMPLANT
GLOVE SURG SS PI 7.0 STRL IVOR (GLOVE) ×2 IMPLANT
GLOVE SURG SYN 7.5  E (GLOVE) ×8
GLOVE SURG SYN 7.5 E (GLOVE) ×4 IMPLANT
GLOVE SURG SYN 7.5 PF PI (GLOVE) ×4 IMPLANT
GOWN SRG XL XLNG 56XLVL 4 (GOWN DISPOSABLE) ×1 IMPLANT
GOWN STRL NON-REIN XL XLG LVL4 (GOWN DISPOSABLE) ×2
GOWN STRL REUS W/ TWL LRG LVL3 (GOWN DISPOSABLE) IMPLANT
GOWN STRL REUS W/ TWL XL LVL3 (GOWN DISPOSABLE) ×1 IMPLANT
GOWN STRL REUS W/TWL LRG LVL3 (GOWN DISPOSABLE)
GOWN STRL REUS W/TWL XL LVL3 (GOWN DISPOSABLE) ×2
HANDPIECE INTERPULSE COAX TIP (DISPOSABLE) ×2
HEAD M SROM 36MM 2 (Hips) IMPLANT
HOOD PEEL AWAY FLYTE STAYCOOL (MISCELLANEOUS) ×6 IMPLANT
IV NS IRRIG 3000ML ARTHROMATIC (IV SOLUTION) ×3 IMPLANT
KIT BASIN OR (CUSTOM PROCEDURE TRAY) ×3 IMPLANT
LINER NEUTRAL 52X36MM PLUS 4 (Liner) ×2 IMPLANT
MARKER SKIN DUAL TIP RULER LAB (MISCELLANEOUS) ×3 IMPLANT
NDL SPNL 18GX3.5 QUINCKE PK (NEEDLE) ×1 IMPLANT
NEEDLE SPNL 18GX3.5 QUINCKE PK (NEEDLE) ×3 IMPLANT
PACK TOTAL JOINT (CUSTOM PROCEDURE TRAY) ×3 IMPLANT
PACK UNIVERSAL I (CUSTOM PROCEDURE TRAY) ×3 IMPLANT
PIN SECTOR W/GRIP ACE CUP 52MM (Hips) ×2 IMPLANT
RETRACTOR WND ALEXIS 18 MED (MISCELLANEOUS) IMPLANT
RTRCTR WOUND ALEXIS 18CM MED (MISCELLANEOUS)
SAW OSC TIP CART 19.5X105X1.3 (SAW) ×3 IMPLANT
SCREW 6.5MMX25MM (Screw) ×2 IMPLANT
SET HNDPC FAN SPRY TIP SCT (DISPOSABLE) ×1 IMPLANT
SROM M HEAD 36MM 2 (Hips) ×3 IMPLANT
STAPLER VISISTAT 35W (STAPLE) IMPLANT
STEM CORAIL KA12 (Stem) ×2 IMPLANT
SUT ETHIBOND 2 V 37 (SUTURE) ×3 IMPLANT
SUT ETHILON 2 0 FS 18 (SUTURE) ×4 IMPLANT
SUT VIC AB 0 CT1 27 (SUTURE) ×2
SUT VIC AB 0 CT1 27XBRD ANBCTR (SUTURE) IMPLANT
SUT VIC AB 1 CT1 27 (SUTURE) ×2
SUT VIC AB 1 CT1 27XBRD ANBCTR (SUTURE) ×1 IMPLANT
SUT VIC AB 2-0 CT1 27 (SUTURE) ×6
SUT VIC AB 2-0 CT1 TAPERPNT 27 (SUTURE) ×2 IMPLANT
SYRINGE 60CC LL (MISCELLANEOUS) ×3 IMPLANT
TOWEL OR 17X26 10 PK STRL BLUE (TOWEL DISPOSABLE) ×3 IMPLANT
YANKAUER SUCT BULB TIP NO VENT (SUCTIONS) ×3 IMPLANT

## 2018-03-06 NOTE — H&P (Signed)

## 2018-03-06 NOTE — Transfer of Care (Signed)
Immediate Anesthesia Transfer of Care Note  Patient: Stephanie Coleman  Procedure(s) Performed: TOTAL HIP ARTHROPLASTY ANTERIOR APPROACH (Left Hip)  Patient Location: PACU  Anesthesia Type:MAC combined with regional for post-op pain  Level of Consciousness: awake, alert , oriented and patient cooperative  Airway & Oxygen Therapy: Patient Spontanous Breathing and Patient connected to nasal cannula oxygen  Post-op Assessment: Report given to RN and Post -op Vital signs reviewed and stable  Post vital signs: Reviewed and stable  Last Vitals:  Vitals Value Taken Time  BP 89/67 03/06/2018  5:16 PM  Temp    Pulse 89 03/06/2018  5:19 PM  Resp 15 03/06/2018  5:19 PM  SpO2 100 % 03/06/2018  5:19 PM  Vitals shown include unvalidated device data.  Last Pain:  Vitals:   03/06/18 1059  TempSrc:   PainSc: 4       Patients Stated Pain Goal: 0 (56/38/93 7342)  Complications: No apparent anesthesia complications

## 2018-03-06 NOTE — Progress Notes (Signed)
PROGRESS NOTE  Stephanie Coleman [facility]:[id number] DOB: 1945-12-30 DOA: 03/05/2018 PCP: Patient, No Pcp Per   LOS: 1 day   Brief Narrative / Interim history: Old female with history of diet-controlled diabetes, hyperlipidemia, who presents to the hospital after mechanical fall resulting in left hip fracture.  Orthopedic surgery was consulted.  Subjective: Doing well this morning given circumstances, she denies any pain at rest and if she does not move.  No chest pain, shortness of breath.  No abdominal pain, nausea or vomiting.  Assessment & Plan: Principal Problem:   Closed displaced fracture of left femoral neck (HCC) Active Problems:   Diet-controlled diabetes mellitus ([place])   Principal Problem Left femoral neck fracture -Orthopedic surgery consulted, appreciate input, she will be taken to the operating room later this afternoon for hip repair -DVT prophylaxis per orthopedic surgery postop -Probably PT to start working with her tomorrow, suspect she will need SNF  Active Problems Diet-controlled diabetes -Hemoglobin A1c was 7.7 in 2019, keep on sliding scale here, CBGs well-controlled 116-141.  Scheduled Meds: . Influenza vac split quadrivalent PF  0.5 mL Intramuscular Tomorrow-1000  . insulin aspart  0-15 Units Subcutaneous TID WC  . insulin aspart  0-5 Units Subcutaneous QHS  . povidone-iodine  2 application Topical Once  . tranexamic acid (CYKLOKAPRON) topical -INTRAOP  2,000 mg Topical To OR   Continuous Infusions: . sodium chloride 125 mL/hr at 03/06/18 0500  .  ceFAZolin (ANCEF) IV    . tranexamic acid     PRN Meds:.acetaminophen **OR** acetaminophen, morphine injection  DVT prophylaxis: SCDs now Code Status: Full code Family Communication: No family present at bedside Disposition Plan: To be determined  Consultants:   Orthopedic surgery  Procedures:   None   Antimicrobials:  None    Objective: Vitals:   03/05/18 2000 03/05/18 2030 03/05/18 2354 03/06/18  0643  BP:  127/75  128/72  Pulse:  91 91 94  Resp:  16  16  Temp:  98.1 F (36.7 C) 98 F (36.7 C) 98.7 F (37.1 C)  TempSrc:  Oral Oral Oral  SpO2:  95%  93%  Weight: 88.4 kg     Height: 5\' 3"  (1.6 m)       Intake/Output Summary (Last 24 hours) at 03/06/2018 1127 Last data filed at 03/06/2018 0600 Gross per 24 hour  Intake 1200 ml  Output 1850 ml  Net -650 ml   Filed Weights   03/05/18 2000  Weight: 88.4 kg    Examination:  Constitutional: NAD Eyes: PERRL, lids and conjunctivae normal ENMT: Mucous membranes are moist.  Respiratory: clear to auscultation bilaterally, no wheezing, no crackles. Normal respiratory effort.  Cardiovascular: Regular rate and rhythm, no murmurs / rubs / gallops. No LE edema. Abdomen: no tenderness. Bowel sounds positive.  Musculoskeletal: no clubbing / cyanosis.  Skin: no rashes Neurologic: CN 2-12 grossly intact. Strength 5/5 in all 4.  Psychiatric: Normal judgment and insight. Alert and oriented x 3. Normal mood.    Data Reviewed: I have independently reviewed following labs and imaging studies   CBC: Recent Labs  Lab 03/05/18 1635 03/06/18 0229  WBC 9.4 6.9  NEUTROABS 7.8*  --   HGB 9.0* 8.5*  HCT 31.8* 30.3*  MCV 70.2* 68.2*  PLT 284 353   Basic Metabolic Panel: Recent Labs  Lab 03/05/18 1635 03/06/18 0229  NA 138 139  K 4.5 3.8  CL 110 109  CO2 23 20*  GLUCOSE 166* 141*  BUN 11 9  CREATININE 0.87  0.70  CALCIUM 9.4 9.0   GFR: Estimated Creatinine Clearance: 65.1 mL/min (by C-G formula based on SCr of 0.7 mg/dL). Liver Function Tests: No results for input(s): AST, ALT, ALKPHOS, BILITOT, PROT, ALBUMIN in the last 168 hours. No results for input(s): LIPASE, AMYLASE in the last 168 hours. No results for input(s): AMMONIA in the last 168 hours. Coagulation Profile: Recent Labs  Lab 03/05/18 1635  INR 1.02   Cardiac Enzymes: No results for input(s): CKTOTAL, CKMB, CKMBINDEX, TROPONINI in the last 168 hours. BNP  (last 3 results) No results for input(s): PROBNP in the last 8760 hours. HbA1C: No results for input(s): HGBA1C in the last 72 hours. CBG: Recent Labs  Lab 03/05/18 2125 03/06/18 0727  GLUCAP 127* 116*   Lipid Profile: No results for input(s): CHOL, HDL, LDLCALC, TRIG, CHOLHDL, LDLDIRECT in the last 72 hours. Thyroid Function Tests: No results for input(s): TSH, T4TOTAL, FREET4, T3FREE, THYROIDAB in the last 72 hours. Anemia Panel: No results for input(s): VITAMINB12, FOLATE, FERRITIN, TIBC, IRON, RETICCTPCT in the last 72 hours. Urine analysis:    Component Value Date/Time   COLORURINE YELLOW 01/29/2012 1447   APPEARANCEUR CLOUDY (A) 01/29/2012 1447   LABSPEC 1.010 01/29/2012 1447   PHURINE 5.5 01/29/2012 1447   GLUCOSEU NEGATIVE 01/29/2012 1447   HGBUR NEGATIVE 01/29/2012 [place] 01/29/2012 1447   KETONESUR NEGATIVE 01/29/2012 1447   PROTEINUR NEGATIVE 01/29/2012 1447   UROBILINOGEN 0.2 01/29/2012 1447   NITRITE NEGATIVE 01/29/2012 1447   LEUKOCYTESUR NEGATIVE 01/29/2012 1447   Sepsis Labs: Invalid input(s): PROCALCITONIN, LACTICIDVEN  Recent Results (from the past 240 hour(s))  MRSA PCR Screening     Status: None   Collection Time: 03/05/18  8:36 PM  Result Value Ref Range Status   MRSA by PCR NEGATIVE NEGATIVE Final    Comment:        The GeneXpert MRSA Assay (FDA approved for NASAL specimens only), is one component of a comprehensive MRSA colonization surveillance program. It is not intended to diagnose MRSA infection nor to guide or monitor treatment for MRSA infections. Performed at [facility], [place] [address], [place], [place] [address]       Radiology Studies: Dg Tibia/fibula Left  Result Date: 03/05/2018 CLINICAL DATA:  Fall, left hip pain EXAM: LEFT TIBIA AND FIBULA - 2 VIEW COMPARISON:  None. FINDINGS: There is no evidence of fracture or other focal bone lesions. Soft tissues are unremarkable. IMPRESSION: Negative.  Electronically Signed   By: Rolm Baptise M.D.   On: 03/05/2018 16:23   Dg Hand Complete Left  Result Date: 03/05/2018 CLINICAL DATA:  Fall EXAM: LEFT HAND - COMPLETE 3+ VIEW COMPARISON:  None. FINDINGS: Degenerative changes at the 1st carpometacarpal joint. No acute bony abnormality. Specifically, no fracture, subluxation, or dislocation. IMPRESSION: No acute bony abnormality. Electronically Signed   By: Rolm Baptise M.D.   On: 03/05/2018 16:22   Dg Hip Unilat With Pelvis 2-3 Views Left  Result Date: 03/05/2018 CLINICAL DATA:  Fall, left hip pain EXAM: DG HIP (WITH OR WITHOUT PELVIS) 2-3V LEFT COMPARISON:  None. FINDINGS: There is a left femoral neck fracture with varus angulation. No subluxation or dislocation. No additional acute bony abnormality. IMPRESSION: Left femoral neck fracture with varus angulation. Electronically Signed   By: Rolm Baptise M.D.   On: 03/05/2018 16:21    Marzetta Board, MD, PhD [facility]  Contact via  [email or web address]  [place] P: [phone]  F: [phone]

## 2018-03-06 NOTE — Discharge Instructions (Signed)
° ° °  1. Change dressings as needed °2. May shower but keep incisions covered and dry °3. Take lovenox to prevent blood clots °4. Take stool softeners as needed °5. Take pain meds as needed ° °

## 2018-03-06 NOTE — Plan of Care (Signed)
  Problem: Education: Goal: Verbalization of understanding the information provided (i.e., activity precautions, restrictions, etc) will improve Outcome: Progressing   Problem: Activity: Goal: Ability to ambulate and perform ADLs will improve Outcome: Progressing   Problem: Pain Management: Goal: Pain level will decrease Outcome: Progressing

## 2018-03-06 NOTE — Op Note (Signed)
TOTAL HIP ARTHROPLASTY ANTERIOR APPROACH  Procedure Note Stephanie Coleman   [id number]  Pre-op Diagnosis: LEFT HIP FRACTURE     Post-op Diagnosis: same   Operative Procedures  1. Total hip replacement; Left hip; uncemented [id number]   Personnel  Surgeon(s): Leandrew Koyanagi, MD  Assist: Madalyn Rob, PA-C; necessary for the timely completion of procedure and due to complexity of procedure.   Anesthesia: spinal  Prosthesis: Depuy Acetabulum: Pinnacle 52 mm Femur: Corail KA 12 Head: 36 mm size: -2 Liner: +4 neutral Bearing Type: metal on poly  Total Hip Arthroplasty (Anterior Approach) Op Note:  After informed consent was obtained and the operative extremity marked in the holding area, the patient was brought back to the operating room and placed supine on the HANA table. Next, the operative extremity was prepped and draped in normal sterile fashion. Surgical timeout occurred verifying patient identification, surgical site, surgical procedure and administration of antibiotics.  A modified anterior Smith-Peterson approach to the hip was performed, using the interval between tensor fascia lata and sartorius.  Dissection was carried bluntly down onto the anterior hip capsule. The lateral femoral circumflex vessels were identified and coagulated. A capsulotomy was performed and the capsular flaps tagged for later repair.  Fluoroscopy was utilized to prepare for the femoral neck cut. The neck osteotomy was performed. The femoral head was removed, the acetabular rim was cleared of soft tissue and attention was turned to reaming the acetabulum.  Sequential reaming was performed under fluoroscopic guidance. We reamed to a size 51 mm, and then impacted the acetabular shell. The liner was then placed after irrigation and attention turned to the femur.  After placing the femoral hook, the leg was taken to externally rotated, extended and adducted position taking care to perform soft tissue  releases to allow for adequate mobilization of the femur. Soft tissue was cleared from the shoulder of the greater trochanter and the hook elevator used to improve exposure of the proximal femur. Sequential broaching performed up to a size 12. Trial neck and head were placed. The leg was brought back up to neutral and the construct reduced. The position and sizing of components, offset and leg lengths were checked using fluoroscopy. Stability of the  construct was checked in extension and external rotation without any subluxation or impingement of prosthesis. We dislocated the prosthesis, dropped the leg back into position, removed trial components, and irrigated copiously. The final stem and head was then placed, the leg brought back up, the system reduced and fluoroscopy used to verify positioning.  We irrigated, obtained hemostasis and closed the capsule using #2 ethibond suture.  One gram of vancomycin powder was placed in the surgical bed. The fascia was closed with #1 vicryl plus, the deep fat layer was closed with 0 vicryl, the subcutaneous layers closed with 2.0 Vicryl Plus and the skin closed with 2.0 nylon and steri strips. A sterile dressing was applied. The patient was awakened in the operating room and taken to recovery in stable condition.  All sponge, needle, and instrument counts were correct at the end of the case.   Position: supine  Complications: none.  Time Out: performed   Drains/Packing: none  Estimated blood loss: see anesthesia record  Returned to Recovery Room: in good condition.   Antibiotics: yes   Mechanical VTE (DVT) Prophylaxis: sequential compression devices, TED thigh-high  Chemical VTE (DVT) Prophylaxis: lovenox   Fluid Replacement: see anesthesia record  Specimens Removed: 1 to pathology   Sponge  and Instrument Count Correct? yes   PACU: portable radiograph - low AP   Plan/RTC: Return in 2 weeks for staple removal. Weight Bearing/Load Lower Extremity:  full  Hip precautions: none Suture Removal: 2 weeks   N. Eduard Roux, MD [place] 4:52 PM     Implant Name Type Inv. Item Serial No. Manufacturer Lot No. LRB No. Used  PIN SECTOR W/GRIP ACE CUP 52MM - [id number] Hips PIN SECTOR W/GRIP ACE CUP 52MM  DEPU[employer]ES [id number] Left 1  SCREW 6.5MMX25MM - [id number] Screw SCREW 6.5MMX25MM  DEPU[employer] SYN[employer]ES [id number] Left 1  LINER NEUTRAL 52X36MM PLUS 4 - [id number] Liner LINER NEUTRAL 52X36MM PLUS 4  DEPUY SYN[employer]ES [id number] Left 1  SROM M HEAD 36MM 2 - [id number] Hips SROM M HEAD 36MM 2  DEPU[employer] SYN[employer]ES [id number] Left 1  STEM CORAIL KA12 - [id number] Stem STEM CORAIL KA12  DEPUY SYN[employer]ES [id number] Left 1

## 2018-03-06 NOTE — Anesthesia Preprocedure Evaluation (Signed)
Anesthesia Evaluation    Reviewed: Allergy & Precautions, Patient's Chart, lab work & pertinent test results  History of Anesthesia Complications Negative for: history of anesthetic complications  Airway Mallampati: II  TM Distance: >3 FB Neck ROM: Full    Dental no notable dental hx. (+) Teeth Intact   Pulmonary neg pulmonary ROS,    Pulmonary exam normal        Cardiovascular negative cardio ROS Normal cardiovascular exam     Neuro/Psych negative neurological ROS  negative psych ROS   GI/Hepatic negative GI ROS, Neg liver ROS,   Endo/Other  diabetes, Well Controlled  Renal/GU negative Renal ROS  negative genitourinary   Musculoskeletal negative musculoskeletal ROS (+)   Abdominal   Peds  Hematology  (+) anemia ,   Anesthesia Other Findings 73 yo F for L THA - HLD, DM (diet controlled), anemia (Hgb 8.5) - hip fx 2/2 mechanical fall  Reproductive/Obstetrics                             Anesthesia Physical Anesthesia Plan  ASA: III  Anesthesia Plan: Spinal   Post-op Pain Management:    Induction: Intravenous  PONV Risk Score and Plan: 2 and Propofol infusion and Treatment may vary due to age or medical condition  Airway Management Planned: Nasal Cannula and Simple Face Mask  Additional Equipment: None  Intra-op Plan:   Post-operative Plan:   Informed Consent: I have reviewed the patients History and Physical, chart, labs and discussed the procedure including the risks, benefits and alternatives for the proposed anesthesia with the patient or authorized representative who has indicated his/her understanding and acceptance.       Plan Discussed with:   Anesthesia Plan Comments:         Anesthesia Quick Evaluation

## 2018-03-06 NOTE — Anesthesia Procedure Notes (Signed)
Spinal  Patient location during procedure: OR Staffing Anesthesiologist: Nishawn Rotan E, MD Performed: anesthesiologist  Preanesthetic Checklist Completed: patient identified, surgical consent, pre-op evaluation, timeout performed, IV checked, risks and benefits discussed and monitors and equipment checked Spinal Block Patient position: right lateral decubitus Prep: site prepped and draped and DuraPrep Patient monitoring: continuous pulse ox, blood pressure and heart rate Approach: midline Location: L3-4 Injection technique: single-shot Needle Needle type: Pencan  Needle gauge: 24 G Needle length: 9 cm Additional Notes Functioning IV was confirmed and monitors were applied. Sterile prep and drape, including hand hygiene and sterile gloves were used. The patient was positioned and the spine was prepped. The skin was anesthetized with lidocaine.  Free flow of clear CSF was obtained prior to injecting local anesthetic into the CSF. The needle was carefully withdrawn. The patient tolerated the procedure well.      

## 2018-03-07 ENCOUNTER — Encounter (HOSPITAL_COMMUNITY): Payer: Self-pay | Admitting: Orthopaedic Surgery

## 2018-03-07 DIAGNOSIS — D62 Acute posthemorrhagic anemia: Secondary | ICD-10-CM

## 2018-03-07 LAB — BASIC METABOLIC PANEL
Anion gap: 7 (ref 5–15)
BUN: 6 mg/dL — ABNORMAL LOW (ref 8–23)
CO2: 22 mmol/L (ref 22–32)
Calcium: 9 mg/dL (ref 8.9–10.3)
Chloride: 105 mmol/L (ref 98–111)
Creatinine, Ser: 0.69 mg/dL (ref 0.44–1.00)
GFR calc Af Amer: >60 mL/min (ref >60)
GFR calc non Af Amer: >60 mL/min (ref >60)
Glucose, Bld: 162 mg/dL — ABNORMAL HIGH (ref 70–99)
Potassium: 3.8 mmol/L (ref 3.5–5.1)
Sodium: 134 mmol/L — ABNORMAL LOW (ref 135–145)

## 2018-03-07 LAB — CBC
HCT: 24.5 % — ABNORMAL LOW (ref 36.0–46.0)
Hemoglobin: 7.2 g/dL — ABNORMAL LOW (ref 12.0–15.0)
MCH: 19.7 pg — ABNORMAL LOW (ref 26.0–34.0)
MCHC: 29.4 g/dL — ABNORMAL LOW (ref 30.0–36.0)
MCV: 67.1 fL — AB (ref 80.0–100.0)
NRBC: 0 % (ref 0.0–0.2)
Platelets: 215 10*3/uL (ref 150–400)
RBC: 3.65 MIL/uL — ABNORMAL LOW (ref 3.87–5.11)
RDW: 18.1 % — ABNORMAL HIGH (ref 11.5–15.5)
WBC: 8.5 10*3/uL (ref 4.0–10.5)

## 2018-03-07 LAB — GLUCOSE, CAPILLARY
GLUCOSE-CAPILLARY: 146 mg/dL — AB (ref 70–99)
Glucose-Capillary: 158 mg/dL — ABNORMAL HIGH (ref 70–99)
Glucose-Capillary: 125 mg/dL — ABNORMAL HIGH (ref 70–99)
Glucose-Capillary: 144 mg/dL — ABNORMAL HIGH (ref 70–99)

## 2018-03-07 LAB — PREPARE RBC (CROSSMATCH)

## 2018-03-07 MED ORDER — SODIUM CHLORIDE 0.9% IV SOLUTION
Freq: Once | INTRAVENOUS | Status: AC
  Administered 2018-03-07: 10:00:00 via INTRAVENOUS

## 2018-03-07 NOTE — Plan of Care (Signed)

## 2018-03-07 NOTE — Anesthesia Postprocedure Evaluation (Signed)
Anesthesia Post Note  Patient: Stephanie Coleman  Procedure(s) Performed: TOTAL HIP ARTHROPLASTY ANTERIOR APPROACH (Left Hip)     Patient location during evaluation: PACU Anesthesia Type: Spinal and MAC Level of consciousness: awake and alert Pain management: pain level controlled Vital Signs Assessment: post-procedure vital signs reviewed and stable Respiratory status: spontaneous breathing, nonlabored ventilation, respiratory function stable and patient connected to nasal cannula oxygen Cardiovascular status: stable and blood pressure returned to baseline Postop Assessment: no apparent nausea or vomiting and spinal receding Anesthetic complications: no    Last Vitals:  Vitals:   03/07/18 1837 03/07/18 2015  BP: (!) 151/81 134/70  Pulse: (!) 103 98  Resp: 14 14  Temp: 37.9 C 37.4 C  SpO2: 97% 100%    Last Pain:  Vitals:   03/07/18 2015  TempSrc: Oral  PainSc:                  Tikita Mabee

## 2018-03-07 NOTE — Progress Notes (Signed)
PROGRESS NOTE  Stephanie Coleman TKZ:[id number] DOB: 1945-06-15 DOA: 03/05/2018 PCP: Patient, No Pcp Per   LOS: 2 days   Brief Narrative / Interim history: Old female with history of diet-controlled diabetes, hyperlipidemia, who presents to the hospital after mechanical fall resulting in left hip fracture.  Orthopedic surgery was consulted.  Subjective: Not in much pain this morning, no abdominal pain, no chest pain, no shortness of breath  Assessment & Plan: Principal Problem:   Closed displaced fracture of left femoral neck (HCC) Active Problems:   Diet-controlled diabetes mellitus ([place])   Principal Problem Left femoral neck fracture -Orthopedic surgery consulted, appreciate input, she is status post THA by Dr. Erlinda Hong on 2/24 -DVT prophylaxis per orthopedic surgery postop -PT consulted, to work with patient today, she would prefer to go home  Active Problems Diet-controlled diabetes -Hemoglobin A1c was 7.7 in 2019, keep on sliding scale here, CBGs controlled  Acute blood loss anemia, postop -Hemoglobin 7.2 this morning, transfuse 2 units of packed red blood cells   Scheduled Meds: . acetaminophen  500 mg Oral Q6H  . docusate sodium  100 mg Oral BID  . enoxaparin (LOVENOX) injection  40 mg Subcutaneous Q24H  . Influenza vac split quadrivalent PF  0.5 mL Intramuscular Tomorrow-1000  . insulin aspart  0-15 Units Subcutaneous TID WC  . insulin aspart  0-5 Units Subcutaneous QHS   Continuous Infusions: . sodium chloride Stopped (03/06/18 1330)  . sodium chloride 75 mL/hr at 03/07/18 0400  . lactated ringers 10 mL/hr at 03/06/18 1424  . methocarbamol (ROBAXIN) IV     PRN Meds:.acetaminophen **OR** acetaminophen, acetaminophen, alum & mag hydroxide-simeth, HYDROcodone-acetaminophen, HYDROcodone-acetaminophen, magnesium citrate, menthol-cetylpyridinium **OR** phenol, methocarbamol **OR** methocarbamol (ROBAXIN) IV, morphine injection, ondansetron **OR** ondansetron (ZOFRAN) IV,  polyethylene glycol, sorbitol  DVT prophylaxis: SCDs now Code Status: Full code Family Communication: No family present at bedside Disposition Plan: To be determined, SNF versus home in 1 to 2 days  Consultants:   Orthopedic surgery  Procedures:   Total hip replacement 2/24  Antimicrobials:  None    Objective: Vitals:   03/06/18 1915 03/06/18 1940 03/07/18 0048 03/07/18 0600  BP: 136/74 (!) 141/78 135/73 111/62  Pulse: 93 97 96 93  Resp: 17 16 14 14   Temp: 97.8 F (36.6 C) 98 F (36.7 C) 98.5 F (36.9 C) 98.2 F (36.8 C)  TempSrc:  Oral Oral Oral  SpO2: 100% 97% 98% 98%  Weight:      Height:        Intake/Output Summary (Last 24 hours) at 03/07/2018 1050 Last data filed at 03/07/2018 0524 Gross per 24 hour  Intake 2759.15 ml  Output 2495 ml  Net 264.15 ml   Filed Weights   03/05/18 2000 03/06/18 1402  Weight: 88.4 kg 88.4 kg    Examination:  Constitutional: No distress Eyes: No scleral icterus ENMT: mmm Respiratory: Clear to auscultation bilaterally without wheezing or crackles.  Normal respiratory effort Cardiovascular: Regular rate and rhythm, no murmurs appreciated.  No peripheral edema Abdomen: Soft, nontender, nondistended, positive bowel sounds.  Musculoskeletal: no clubbing / cyanosis.  Skin: No rashes seen, surgical dressing over left hip CDI Neurologic: Nonfocal, equal strength Psychiatric: Normal judgment and insight. Alert and oriented x 3. Normal mood.    Data Reviewed: I have independently reviewed following labs and imaging studies   CBC: Recent Labs  Lab 03/05/18 1635 03/06/18 0229 03/06/18 1653 03/07/18 0325  WBC 9.4 6.9  --  8.5  NEUTROABS 7.8*  --   --   --  HGB 9.0* 8.5* 8.8* 7.2*  HCT 31.8* 30.3* 26.0* 24.5*  MCV 70.2* 68.2*  --  67.1*  PLT 284 268  --  009   Basic Metabolic Panel: Recent Labs  Lab 03/05/18 1635 03/06/18 0229 03/06/18 1653 03/07/18 0325  NA 138 139 138 134*  K 4.5 3.8 3.8 3.8  CL 110 109  --   105  CO2 23 20*  --  22  GLUCOSE 166* 141* 144* 162*  BUN 11 9  --  6*  CREATININE 0.87 0.70  --  0.69  CALCIUM 9.4 9.0  --  9.0   GFR: Estimated Creatinine Clearance: 65.1 mL/min (by C-G formula based on SCr of 0.69 mg/dL). Liver Function Tests: No results for input(s): AST, ALT, ALKPHOS, BILITOT, PROT, ALBUMIN in the last 168 hours. No results for input(s): LIPASE, AMYLASE in the last 168 hours. No results for input(s): AMMONIA in the last 168 hours. Coagulation Profile: Recent Labs  Lab 03/05/18 1635  INR 1.02   Cardiac Enzymes: No results for input(s): CKTOTAL, CKMB, CKMBINDEX, TROPONINI in the last 168 hours. BNP (last 3 results) No results for input(s): PROBNP in the last 8760 hours. HbA1C: No results for input(s): HGBA1C in the last 72 hours. CBG: Recent Labs  Lab 03/06/18 1256 03/06/18 1404 03/06/18 1718 03/06/18 2247 03/07/18 0513  GLUCAP 104* 103* 122* 147* 146*   Lipid Profile: No results for input(s): CHOL, HDL, LDLCALC, TRIG, CHOLHDL, LDLDIRECT in the last 72 hours. Thyroid Function Tests: No results for input(s): TSH, T4TOTAL, FREET4, T3FREE, THYROIDAB in the last 72 hours. Anemia Panel: No results for input(s): VITAMINB12, FOLATE, FERRITIN, TIBC, IRON, RETICCTPCT in the last 72 hours. Urine analysis:    Component Value Date/Time   COLORURINE YELLOW 01/29/2012 1447   APPEARANCEUR CLOUDY (A) 01/29/2012 1447   LABSPEC 1.010 01/29/2012 1447   PHURINE 5.5 01/29/2012 1447   GLUCOSEU NEGATIVE 01/29/2012 1447   HGBUR NEGATIVE 01/29/2012 [place] 01/29/2012 1447   KETONESUR NEGATIVE 01/29/2012 1447   PROTEINUR NEGATIVE 01/29/2012 1447   UROBILINOGEN 0.2 01/29/2012 1447   NITRITE NEGATIVE 01/29/2012 1447   LEUKOCYTESUR NEGATIVE 01/29/2012 1447   Sepsis Labs: Invalid input(s): PROCALCITONIN, LACTICIDVEN  Recent Results (from the past 240 hour(s))  MRSA PCR Screening     Status: None   Collection Time: 03/05/18  8:36 PM  Result  Value Ref Range Status   MRSA by PCR NEGATIVE NEGATIVE Final    Comment:        The GeneXpert MRSA Assay (FDA approved for NASAL specimens only), is one component of a comprehensive MRSA colonization surveillance program. It is not intended to diagnose MRSA infection nor to guide or monitor treatment for MRSA infections. Performed at [facility], [place] [address] [place], [place] [address]       Radiology Studies: Dg Tibia/fibula Left  Result Date: 03/05/2018 CLINICAL DATA:  Fall, left hip pain EXAM: LEFT TIBIA AND FIBULA - 2 VIEW COMPARISON:  None. FINDINGS: There is no evidence of fracture or other focal bone lesions. Soft tissues are unremarkable. IMPRESSION: Negative. Electronically Signed   By: Rolm Baptise M.D.   On: 03/05/2018 16:23   Pelvis Portable  Result Date: 03/06/2018 CLINICAL DATA:  73 year old female. Post left hip replacement. Subsequent encounter. EXAM: PORTABLE PELVIS 1-2 VIEWS COMPARISON:  Intraoperative exam same date. FINDINGS: Post total left hip replacement which appears in satisfactory position without complication noted on frontal projection. IMPRESSION: Post left hip replacement. Electronically Signed   By:  Genia Del M.D.   On: 03/06/2018 17:35   Dg Hand Complete Left  Result Date: 03/05/2018 CLINICAL DATA:  Fall EXAM: LEFT HAND - COMPLETE 3+ VIEW COMPARISON:  None. FINDINGS: Degenerative changes at the 1st carpometacarpal joint. No acute bony abnormality. Specifically, no fracture, subluxation, or dislocation. IMPRESSION: No acute bony abnormality. Electronically Signed   By: Rolm Baptise M.D.   On: 03/05/2018 16:22   Dg C-arm 1-60 Min  Result Date: 03/06/2018 CLINICAL DATA:  73 year old female post left hip replacement. Initial encounter. EXAM: OPERATIVE left HIP (WITH PELVIS IF PERFORMED) 2 VIEWS TECHNIQUE: Fluoroscopic spot image(s) were submitted for interpretation post-operatively. Fluoroscopic time: Not provided. COMPARISON:  None. FINDINGS:  Two intraoperative C-arm views of the left hip submitted for review after surgery. This reveals total left hip replacement in satisfactory position on frontal imaging. IMPRESSION: Post total left hip replacement. Electronically Signed   By: Genia Del M.D.   On: 03/06/2018 17:33   Dg Hip Operative Unilat W Or W/o Pelvis Left  Result Date: 03/06/2018 CLINICAL DATA:  73 year old female post left hip replacement. Initial encounter. EXAM: OPERATIVE left HIP (WITH PELVIS IF PERFORMED) 2 VIEWS TECHNIQUE: Fluoroscopic spot image(s) were submitted for interpretation post-operatively. Fluoroscopic time: Not provided. COMPARISON:  None. FINDINGS: Two intraoperative C-arm views of the left hip submitted for review after surgery. This reveals total left hip replacement in satisfactory position on frontal imaging. IMPRESSION: Post total left hip replacement. Electronically Signed   By: Genia Del M.D.   On: 03/06/2018 17:33   Dg Hip Unilat With Pelvis 2-3 Views Left  Result Date: 03/05/2018 CLINICAL DATA:  Fall, left hip pain EXAM: DG HIP (WITH OR WITHOUT PELVIS) 2-3V LEFT COMPARISON:  None. FINDINGS: There is a left femoral neck fracture with varus angulation. No subluxation or dislocation. No additional acute bony abnormality. IMPRESSION: Left femoral neck fracture with varus angulation. Electronically Signed   By: Rolm Baptise M.D.   On: 03/05/2018 16:21    Marzetta Board, MD, PhD [facility]  Contact via  [email or web address]  [place] P: [phone]  F: [phone]

## 2018-03-07 NOTE — Progress Notes (Signed)
Subjective: 1 Day Post-Op Procedure(s) (LRB): TOTAL HIP ARTHROPLASTY ANTERIOR APPROACH (Left) Patient reports pain as mild.  No lightheadedness/dizziness, nausea or vomiting.   Objective: Vital signs in last 24 hours: Temp:  [97.2 F (36.2 C)-98.5 F (36.9 C)] 98.2 F (36.8 C) (02/25 0600) Pulse Rate:  [88-98] 93 (02/25 0600) Resp:  [14-19] 14 (02/25 0600) BP: (111-141)/(62-81) 111/62 (02/25 0600) SpO2:  [97 %-100 %] 98 % (02/25 0600) Weight:  [88.4 kg] 88.4 kg (02/24 1402)  Intake/Output from previous day: 02/24 0701 - 02/25 0700 In: 2759.2 [I.V.:2409.3; IV Piggyback:349.9] Out: 2495 [Urine:2045; Blood:450] Intake/Output this shift: No intake/output data recorded.  Recent Labs    03/05/18 1635 03/06/18 0229 03/06/18 1653 03/07/18 0325  HGB 9.0* 8.5* 8.8* 7.2*   Recent Labs    03/06/18 0229 03/06/18 1653 03/07/18 0325  WBC 6.9  --  8.5  RBC 4.44  --  3.65*  HCT 30.3* 26.0* 24.5*  PLT 268  --  215   Recent Labs    03/06/18 0229 03/06/18 1653 03/07/18 0325  NA 139 138 134*  K 3.8 3.8 3.8  CL 109  --  105  CO2 20*  --  22  BUN 9  --  6*  CREATININE 0.70  --  0.69  GLUCOSE 141* 144* 162*  CALCIUM 9.0  --  9.0   Recent Labs    03/05/18 1635  INR 1.02    Neurologically intact Neurovascular intact Sensation intact distally Intact pulses distally Dorsiflexion/Plantar flexion intact Incision: dressing C/D/I No cellulitis present Compartment soft   Assessment/Plan: 1 Day Post-Op Procedure(s) (LRB): TOTAL HIP ARTHROPLASTY ANTERIOR APPROACH (Left) Up with therapy  WBAT LLE ABLA- asymptomatic, but should continue to trend down even more over the next 24 hours.  May need transfusion, but will defer to medicine team D/c dispo per medicine- will likely need SNF as she lives alone      Aundra Dubin 03/07/2018, 7:55 AM

## 2018-03-07 NOTE — Evaluation (Addendum)
Occupational Therapy Evaluation Patient Details Name: Stephanie Coleman MRN: [id number] DOB: [birth year] Today's Date: 03/07/2018    History of Present Illness 73 y.o. female with a history of diet-controlled diabetes, hyperlipidemia.  Patient seen for pain in the left hip after a mechanical fall.  She stepped off a 2 foot platform landing mostly on her left foot and had immediate pain in the left hip. s/p L THA direct anterior approach 03/06/18. PMH:DDD, DM controlled by diet   Clinical Impression   This 73 y/o female presents with the above. At baseline pt is independent with ADL, iADL and functional mobility, lives alone in a second floor apartment. Pt presents supine in bed pleasant and willing to participate in therapy session. She currently requires minA for bed mobility, completing short distance mobility in room using RW with minA, minguard assist for seated UB ADL and maxA for LB ADL secondary to LLE pain/stiffness. Pt HR intermittently elevated (up to 129) with brief room level activity today. She reports she has friends/family who can check in intermittently but does not have 24hr assist at home. Pt verbalizes wishes to return home after hospitalization (vs SNF), however given her current level of assist and that pt lives alone recommend initial ST therapy services in SNF setting after discharge to maximize her safety and independence with ADL and mobility. Pt may also be a good candidate for [facility] program with [facility] if eligible. Will continue to follow acutely for pt progress and to maximize her safety and independence with ADL and mobility prior to discharge.     Follow Up Recommendations  SNF;Supervision/Assistance - 24 hour(may benefit from HomeFirst program with [facility] if eligible )    Equipment Recommendations  3 in 1 bedside commode;Other (comment)(TBD in next venue)           Precautions / Restrictions Precautions Precautions: Fall Restrictions Weight Bearing Restrictions:  Yes LLE Weight Bearing: Weight bearing as tolerated      Mobility Bed Mobility Overal bed mobility: Needs Assistance Bed Mobility: Supine to Sit     Supine to sit: Min assist;HOB elevated     General bed mobility comments: HOB elevated and use of bed rails; assist for LLE  Transfers Overall transfer level: Needs assistance Equipment used: Rolling walker (2 wheeled) Transfers: Sit to/from Stand Sit to Stand: Min assist         General transfer comment: VCs for safe hand placement, assist to rise and steady at [employer]                                           ADL either performed or assessed with clinical judgement   ADL Overall ADL's : Needs assistance/impaired Eating/Feeding: Modified independent;Sitting   Grooming: Set up;Sitting   Upper Body Bathing: Min guard;Sitting   Lower Body Bathing: Minimal assistance;Moderate assistance;Sit to/from stand   Upper Body Dressing : Min guard;Sitting   Lower Body Dressing: Maximal assistance;Sit to/from stand   Toilet Transfer: Minimal assistance;Ambulation;RW Toilet Transfer Details (indicate cue type and reason): simulated via transfer to recliner  Toileting- Clothing Manipulation and Hygiene: Moderate assistance;Sit to/from stand       Functional mobility during ADLs: Minimal assistance;Rolling walker General ADL Comments: pt limited due to post op pain and weakness, HR elevated to 129 with minimal room level activity     Vision  Perception     Praxis      Pertinent Vitals/Pain Pain Assessment: 0-10 Pain Score: 4  Pain Location: L hip with wt bearing, mobility Pain Descriptors / Indicators: Discomfort;Sore Pain Intervention(s): Limited activity within patient's tolerance;Monitored during session;Repositioned     Hand Dominance     Extremity/Trunk Assessment Upper Extremity Assessment Upper Extremity Assessment: Overall WFL for tasks assessed   Lower Extremity  Assessment Lower Extremity Assessment: Defer to PT evaluation       Communication Communication Communication: No difficulties   Cognition Arousal/Alertness: Awake/alert Behavior During Therapy: WFL for tasks assessed/performed Overall Cognitive Status: Within Functional Limits for tasks assessed                                 General Comments: overall WFL for basic mobility/ADL tasks, pt does require cues to wait for therapist and for placement of RW prior to standing as pt initially initiates movement to stand without therapist assist or use of AD    General Comments  pt receiving unit of blood during session, RN verbalized okay to work with pt, pt denies any symptoms of dizziness/lightheadedness during session    Exercises     Shoulder Instructions      Home Living Family/patient expects to be discharged to:: Private residence Living Arrangements: Alone Available Help at Discharge: Friend(s);Family;Available PRN/intermittently Type of Home: Apartment Home Access: Stairs to enter Entrance Stairs-Number of Steps: flight Entrance Stairs-Rails: Left;Right;Can reach both Home Layout: One level         Biochemist, clinical: Standard     Home Equipment: None          Prior Functioning/Environment Level of Independence: Independent        Comments: performing iADLs, driving, has a cat        OT Problem List: Decreased strength;Decreased range of motion;Decreased activity tolerance;Impaired balance (sitting and/or standing);Decreased safety awareness;Decreased knowledge of use of DME or AE;Pain      OT Treatment/Interventions: Self-care/ADL training;Therapeutic exercise;Neuromuscular education;DME and/or AE instruction;Therapeutic activities;Patient/family education;Balance training    OT Goals(Current goals can be found in the care plan section) Acute Rehab OT Goals Patient Stated Goal: wants to return home to her cat OT Goal Formulation: With  patient Time For Goal Achievement: 03/21/18 Potential to Achieve Goals: Good  OT Frequency: Min 2X/week   Barriers to D/C:            Co-evaluation              AM-PAC OT "6 Clicks" Daily Activity     Outcome Measure Help from another person eating meals?: None Help from another person taking care of personal grooming?: A Little Help from another person toileting, which includes using toliet, bedpan, or urinal?: A Lot Help from another person bathing (including washing, rinsing, drying)?: A Lot Help from another person to put on and taking off regular upper body clothing?: None Help from another person to put on and taking off regular lower body clothing?: A Lot 6 Click Score: 17   End of Session Equipment Utilized During Treatment: Gait belt;Rolling walker Nurse Communication: Mobility status  Activity Tolerance: Patient tolerated treatment well Patient left: in chair;with call bell/phone within reach;with chair alarm set  OT Visit Diagnosis: Other abnormalities of gait and mobility (R26.89);Muscle weakness (generalized) (M62.81)                Time: 6295-2841 OT Time Calculation (min): 22 min Charges:  OT General Charges $OT Visit: 1 Visit OT Evaluation $OT Eval Low Complexity: 1 Low  Lou Cal, OT Supplemental Rehabilitation Services Pager [phone] Office [phone]   Raymondo Band 03/07/2018, 1:41 PM

## 2018-03-07 NOTE — Evaluation (Signed)
Physical Therapy Evaluation Patient Details Name: Stephanie Coleman MRN: [id number] DOB: [birth year] Today's Date: 03/07/2018   History of Present Illness  73 y.o. female with a history of diet-controlled diabetes, hyperlipidemia.  Patient seen for pain in the left hip after a mechanical fall.  She stepped off a 2 foot platform landing mostly on her left foot and had immediate pain in the left hip. s/p L THA direct anterior approach 03/06/18. PMH:DDD, DM controlled by diet  Clinical Impression  PTA pt living alone in 2nd story apartment with flight of stairs and independent with mobility and iADLs. Evaluation limited as pt was getting ready to take a shower. Pt is limited in safe mobility by hip pain and decreased strength, balance and endurance. Pt requires minA for bed mobility, transfers and ambulation with RW. Given pt decreased caregiver support and need to be able to ascend/descend flight of stairs to get into her apartment, PT recommending SNF level care at d/c. PT will follow back tomorrow to work on stair training to work towards possible d/c home.     Follow Up Recommendations SNF    Equipment Recommendations  Other (comment)(to be determined at next venue)       Precautions / Restrictions Precautions Precautions: Fall Restrictions Weight Bearing Restrictions: Yes LLE Weight Bearing: Weight bearing as tolerated      Mobility  Bed Mobility Overal bed mobility: Needs Assistance Bed Mobility: Supine to Sit     Supine to sit: Min assist;HOB elevated     General bed mobility comments: HOB elevated and use of bed rails; assist for managment of LE to floor  Transfers Overall transfer level: Needs assistance Equipment used: Rolling walker (2 wheeled) Transfers: Sit to/from Stand Sit to Stand: Min assist         General transfer comment: min A for steadying in standing, good hand placement for powerup   Ambulation/Gait Ambulation/Gait assistance: Min assist Gait Distance  (Feet): 16 Feet Assistive device: Rolling walker (2 wheeled) Gait Pattern/deviations: Step-through pattern;Antalgic;Trunk flexed Gait velocity: slowed Gait velocity interpretation: <1.8 ft/sec, indicate of risk for recurrent falls General Gait Details: minA for steadying with RW for ambulation into bathroom to take shower with NT, vc for proximity to RW, upright posture        Balance Overall balance assessment: Needs assistance Sitting-balance support: No upper extremity supported;Feet supported Sitting balance-Leahy Scale: Good     Standing balance support: Bilateral upper extremity supported Standing balance-Leahy Scale: Poor Standing balance comment: requires UE support for maintaining balance                             Pertinent Vitals/Pain Pain Assessment: Faces Faces Pain Scale: Hurts little more Pain Location: L hip with wt bearing, mobility Pain Descriptors / Indicators: Discomfort;Sore Pain Intervention(s): Limited activity within patient's tolerance;Monitored during session;Repositioned    Home Living Family/patient expects to be discharged to:: Private residence Living Arrangements: Alone Available Help at Discharge: Friend(s);Family;Available PRN/intermittently Type of Home: Apartment Home Access: Stairs to enter Entrance Stairs-Rails: Left;Right;Can reach both Entrance Stairs-Number of Steps: flight Home Layout: One level Home Equipment: None      Prior Function Level of Independence: Independent         Comments: performing iADLs, driving, has a cat        Extremity/Trunk Assessment   Upper Extremity Assessment Upper Extremity Assessment: Overall WFL for tasks assessed    Lower Extremity Assessment Lower Extremity Assessment: LLE deficits/detail  LLE Deficits / Details: L hip and knee ROM limited by surgical intervention and pain, ankle dorsi/plantar WFL LLE: Unable to fully assess due to pain       Communication    Communication: No difficulties  Cognition Arousal/Alertness: Awake/alert Behavior During Therapy: WFL for tasks assessed/performed Overall Cognitive Status: Within Functional Limits for tasks assessed                                 General Comments: overall WFL for basic mobility/ADL tasks, pt does require cues to wait for therapist and for placement of RW prior to standing as pt initially initiates movement to stand without therapist assist or use of AD              Assessment/Plan    PT Assessment Patient needs continued PT services  PT Problem List Decreased strength;Decreased range of motion;Decreased activity tolerance;Decreased balance;Decreased mobility;Pain       PT Treatment Interventions DME instruction;Gait training;Functional mobility training;Therapeutic activities;Therapeutic exercise;Stair training;Balance training;Patient/family education    PT Goals (Current goals can be found in the Care Plan section)  Acute Rehab PT Goals Patient Stated Goal: wants to return home to her cat PT Goal Formulation: With patient Time For Goal Achievement: 03/21/18 Potential to Achieve Goals: Good    Frequency Min 5X/week    AM-PAC PT "6 Clicks" Mobility  Outcome Measure Help needed turning from your back to your side while in a flat bed without using bedrails?: A Little Help needed moving from lying on your back to sitting on the side of a flat bed without using bedrails?: A Little Help needed moving to and from a bed to a chair (including a wheelchair)?: A Little Help needed standing up from a chair using your arms (e.g., wheelchair or bedside chair)?: A Little Help needed to walk in hospital room?: A Little Help needed climbing 3-5 steps with a railing? : A Lot 6 Click Score: 17    End of Session Equipment Utilized During Treatment: Gait belt Activity Tolerance: Patient tolerated treatment well Patient left: Other (comment)(in shower with NT) Nurse  Communication: Mobility status PT Visit Diagnosis: Unsteadiness on feet (R26.81);Other abnormalities of gait and mobility (R26.89);History of falling (Z91.81);Muscle weakness (generalized) (M62.81);Difficulty in walking, not elsewhere classified (R26.2);Pain Pain - Right/Left: Left Pain - part of body: Hip    Time: 8675-4492 PT Time Calculation (min) (ACUTE ONLY): 9 min   Charges:   PT Evaluation $PT Eval Low Complexity: 1 Low          Micca Matura B. Migdalia Dk PT, DPT Acute Rehabilitation Services Pager [phone] Office [phone]   [place] 03/07/2018, 4:34 PM

## 2018-03-08 DIAGNOSIS — W19XXXA Unspecified fall, initial encounter: Secondary | ICD-10-CM

## 2018-03-08 DIAGNOSIS — Z23 Encounter for immunization: Secondary | ICD-10-CM | POA: Diagnosis not present

## 2018-03-08 DIAGNOSIS — S72002A Fracture of unspecified part of neck of left femur, initial encounter for closed fracture: Principal | ICD-10-CM

## 2018-03-08 LAB — TYPE AND SCREEN
ABO/RH(D): B POS
Antibody Screen: NEGATIVE
UNIT DIVISION: 0
Unit division: 0

## 2018-03-08 LAB — BPAM RBC
Blood Product Expiration Date: 202003182359
Blood Product Expiration Date: 202003182359
ISSUE DATE / TIME: 202002251554
ISSUE DATE / TIME: 202002251115
UNIT TYPE AND RH: 7300
Unit Type and Rh: 7300

## 2018-03-08 LAB — CBC
HCT: 30.7 % — ABNORMAL LOW (ref 36.0–46.0)
Hemoglobin: 9.4 g/dL — ABNORMAL LOW (ref 12.0–15.0)
MCH: 21.8 pg — ABNORMAL LOW (ref 26.0–34.0)
MCHC: 30.6 g/dL (ref 30.0–36.0)
MCV: 71.2 fL — AB (ref 80.0–100.0)
Platelets: 221 10*3/uL (ref 150–400)
RBC: 4.31 MIL/uL (ref 3.87–5.11)
RDW: 21.3 % — AB (ref 11.5–15.5)
WBC: 9.2 10*3/uL (ref 4.0–10.5)
nRBC: 0 % (ref 0.0–0.2)

## 2018-03-08 LAB — GLUCOSE, CAPILLARY
GLUCOSE-CAPILLARY: 139 mg/dL — AB (ref 70–99)
Glucose-Capillary: 203 mg/dL — ABNORMAL HIGH (ref 70–99)

## 2018-03-08 LAB — COMPREHENSIVE METABOLIC PANEL
ALT: 17 U/L (ref 0–44)
ANION GAP: 9 (ref 5–15)
AST: 26 U/L (ref 15–41)
Albumin: 2.8 g/dL — ABNORMAL LOW (ref 3.5–5.0)
Alkaline Phosphatase: 65 U/L (ref 38–126)
BUN: 6 mg/dL — ABNORMAL LOW (ref 8–23)
CO2: 22 mmol/L (ref 22–32)
Calcium: 9.1 mg/dL (ref 8.9–10.3)
Chloride: 104 mmol/L (ref 98–111)
Creatinine, Ser: 0.69 mg/dL (ref 0.44–1.00)
GFR calc Af Amer: >60 mL/min (ref >60)
GFR calc non Af Amer: >60 mL/min (ref >60)
Glucose, Bld: 165 mg/dL — ABNORMAL HIGH (ref 70–99)
Potassium: 3.6 mmol/L (ref 3.5–5.1)
Sodium: 135 mmol/L (ref 135–145)
Total Bilirubin: 1.1 mg/dL (ref 0.3–1.2)
Total Protein: 5.8 g/dL — ABNORMAL LOW (ref 6.5–8.1)

## 2018-03-08 MED ORDER — DOCUSATE SODIUM 100 MG PO CAPS: 100.0000 mg | ORAL_CAPSULE | Freq: Two times a day (BID) | ORAL | 0 refills | Status: DC

## 2018-03-08 MED ORDER — POLYETHYLENE GLYCOL 3350 17 G PO PACK: 17.0000 g | PACK | Freq: Two times a day (BID) | ORAL | 0 refills | Status: AC

## 2018-03-08 NOTE — Care Management Important Message (Signed)
Important Message  Patient Details  Name: Stephanie Coleman MRN: [id number] Date of Birth: 11-Dec-1945   Medicare Important Message Given:  Yes    Orbie Pyo 03/08/2018, 3:53 PM

## 2018-03-08 NOTE — Plan of Care (Signed)
  Problem: Activity: Goal: Ability to ambulate and perform ADLs will improve Outcome: Progressing   Problem: Clinical Measurements: Goal: Postoperative complications will be avoided or minimized Outcome: Progressing   Problem: Pain Management: Goal: Pain level will decrease Outcome: Progressing   

## 2018-03-08 NOTE — Progress Notes (Signed)
Physical Therapy Treatment Patient Details Name: Stephanie Coleman MRN: [id number] DOB: Sep 11, 1945 Today's Date: 03/08/2018    History of Present Illness 73 y.o. female with a history of diet-controlled diabetes, hyperlipidemia.  Patient seen for pain in the left hip after a mechanical fall.  She stepped off a 2 foot platform landing mostly on her left foot and had immediate pain in the left hip. s/p L THA direct anterior approach 03/06/18. PMH:DDD, DM controlled by diet    PT Comments    Pt is making good progress towards her goals today, however is limited in safe mobility by pain, decreased ROM, and decreased endurance. Pt currently requires min guard for bed mobility, transfers and ambulation of 250 as well as ascent/descent of 14 steps with one rail. Given pt ability to climb stairs safely and her desire to return home PT recommending HHPT at discharge with intermittent supervision of family and friends. Pt ready for d/c home this afternoon.    Follow Up Recommendations  Home health PT;Supervision - Intermittent     Equipment Recommendations  Rolling walker with 5" wheels;3in1 (PT)       Precautions / Restrictions Precautions Precautions: Fall Restrictions Weight Bearing Restrictions: Yes LLE Weight Bearing: Weight bearing as tolerated    Mobility  Bed Mobility               General bed mobility comments: OOB in recliner on entry   Transfers Overall transfer level: Needs assistance Equipment used: Rolling walker (2 wheeled) Transfers: Sit to/from Stand Sit to Stand: Min guard         General transfer comment: min guard for safety, good hand placement for powerup   Ambulation/Gait Ambulation/Gait assistance: Min guard Gait Distance (Feet): 250 Feet Assistive device: Rolling walker (2 wheeled) Gait Pattern/deviations: Step-through pattern;Antalgic;Trunk flexed Gait velocity: slowed Gait velocity interpretation: <1.8 ft/sec, indicate of risk for recurrent  falls General Gait Details: min guard for safety, vc for proximity to RW, increased L heel strike and upright posture.   Stairs Stairs: Yes Stairs assistance: Min guard Stair Management: One rail Left;Forwards;Step to pattern Number of Stairs: 14 General stair comments: hands on min guard for safety, vc for sequencing, pt with 2x decreased foot clearance with L LE, pt able to self correct without assist         Balance Overall balance assessment: Needs assistance Sitting-balance support: No upper extremity supported;Feet supported Sitting balance-Leahy Scale: Good     Standing balance support: During functional activity;No upper extremity supported Standing balance-Leahy Scale: Fair Standing balance comment: able to statically stand without UE support                            Cognition Arousal/Alertness: Awake/alert Behavior During Therapy: WFL for tasks assessed/performed Overall Cognitive Status: Within Functional Limits for tasks assessed                                 General Comments: pt continues to be very independent with mobility which sometimes limits her safety awareness      Exercises Total Joint Exercises Ankle Circles/Pumps: AROM;Both;10 reps;Seated Quad Sets: AROM;Left;5 reps;Seated Short Arc Quad: AROM;Left;5 reps;Seated Heel Slides: AROM;Left;5 reps;Seated Hip ABduction/ADduction: AROM;Left;10 reps;Seated;Standing Long Arc Quad: AROM;Left;10 reps;Seated Knee Flexion: AROM;Left;10 reps;Standing Marching in Standing: AROM;Both;10 reps;Standing Standing Hip Extension: AROM;Left;10 reps;Standing    General Comments        Pertinent  Vitals/Pain Pain Assessment: 0-10 Pain Score: 2  Pain Location: L hip with wt bearing, mobility Pain Descriptors / Indicators: Discomfort;Sore Pain Intervention(s): Limited activity within patient's tolerance;Monitored during session;Repositioned           PT Goals (current goals can now  be found in the care plan section) Acute Rehab PT Goals Patient Stated Goal: wants to return home to her cat PT Goal Formulation: With patient Time For Goal Achievement: 03/21/18 Potential to Achieve Goals: Good Progress towards PT goals: Progressing toward goals    Frequency    Min 5X/week      PT Plan Discharge plan needs to be updated       AM-PAC PT "6 Clicks" Mobility   Outcome Measure  Help needed turning from your back to your side while in a flat bed without using bedrails?: A Little Help needed moving from lying on your back to sitting on the side of a flat bed without using bedrails?: A Little Help needed moving to and from a bed to a chair (including a wheelchair)?: A Little Help needed standing up from a chair using your arms (e.g., wheelchair or bedside chair)?: A Little Help needed to walk in hospital room?: A Little Help needed climbing 3-5 steps with a railing? : A Lot 6 Click Score: 17    End of Session Equipment Utilized During Treatment: Gait belt Activity Tolerance: Patient tolerated treatment well Patient left: Other (comment)(in shower with NT) Nurse Communication: Mobility status PT Visit Diagnosis: Unsteadiness on feet (R26.81);Other abnormalities of gait and mobility (R26.89);History of falling (Z91.81);Muscle weakness (generalized) (M62.81);Difficulty in walking, not elsewhere classified (R26.2);Pain Pain - Right/Left: Left Pain - part of body: Hip     Time: 5277-8242 PT Time Calculation (min) (ACUTE ONLY): 29 min  Charges:  $Gait Training: 8-22 mins $Therapeutic Exercise: 8-22 mins                     Stephanie Coleman B. Migdalia Dk PT, DPT Acute Rehabilitation Services Pager [phone] Office [phone]    [place] 03/08/2018, 2:19 PM

## 2018-03-08 NOTE — Consult Note (Signed)
[facility] CM Primary Care Navigator  03/08/2018  Stephanie Coleman [facility] Jun 28, 1945 [id number]   Met withpatientat the bedside to identify possible discharge needs.   Patient reports having a "fall at the apartment complex causing fracture to left hip" that resulted to this admission/ surgery. (closed displaced fracture of left femoral neck underwent left total hip arthroplasty)  Patient verbalized not having a primary care provider and wanted to have one.Called Physician Referral Services who spoke with patient and gave list of physician's number to call and establish care with.   [facility] [facility].   Patientstatesmanaging her ownmedications at [employer] out of the containers.  Patient reports that she has been driving prior to admission/surgery but her friends and neighbor will be able to provide transportation to her doctors'appointments after discharge.  According to patient,she lives alone and independent with care prior to surgery. Her son Stephanie Coleman) lives in [place].-4 hours away, may be coming over this weekend to see her. Patient currently relies on her friends/ neighbor to provide assistance with her needs at home.  Anticipated discharge plan is still to be determined, SNF-skilled nursing facility versus home. Patient wishes to go home, but would likely need short term rehab from skilled nursing facility setting per therapy recommendation.  Patientvoiced understanding to call for a primary care provider, to establish care with and set-up a follow-up appointment to be seen within1- 2weeksor sooner if needs arise.Patient letter was given to her as a reminder.  Explained to patient regarding[facility] CM services available for healthmanagementand resourcesat home but she denied any at this point. Patient states that she is able to manage her health needs/ issues at home so far ("managing diabetes  for 16 years"). Patient was encouraged to discuss with primary care provider of choice for further needsand assistancein managing her health conditions once discharged.  Patient verbalizedunderstandingto seekreferralfrom primary care provider to [facility] care management ifdeemed necessary and appropriatefor anyservicesin the near future- once shereturnshome.  [facility] care management information provided for future needs that she may have.     For additional questions please contact:  Edwena Felty A. [name], BSN, RN-BC [facility] PRIMARY CARE Navigator Cell: [phone]

## 2018-03-08 NOTE — Discharge Summary (Signed)
Physician Discharge Summary  DELANIE TIRRELL CNO:[id number] DOB: [birth year] DOA: 03/05/2018  PCP: Patient, No Pcp Per  Admit date: 03/05/2018 Discharge date: 03/08/2018  Admitted From: Home Disposition:  Home  Recommendations for Outpatient Follow-up:  1. Follow up with PCP in 1-2 weeks 2. Please obtain BMP/CBC in one week   Home Health:Yes Equipment/Devices:None  Discharge Condition:stable CODE STATUS:Full Diet recommendation: Heart Healthy  Brief/Interim Summary: 73 year old female with past medical history of diabetes diet-controlled presents to the hospital after mechanical fall was found to have a left hip fracture  Discharge Diagnoses:  Principal Problem:   Closed displaced fracture of left femoral neck (HCC) Active Problems:   Diet-controlled diabetes mellitus ([place])  Left femoral neck fracture: Orthopedic surgery was consulted, left hip repair was performed on 03/02/2018. They recommended narcotics and Lovenox for prophylaxis. Physical therapy evaluated the patient and they recommended home with home health PT. Her son is coming from [place] on Friday. She will have friends and family help her out at home 24 hours.  Diabetes diet controlled: A1c was 7.7, will need further evaluation by his PCP and as an outpatient.  Currently on metformin.  Acute blood loss anemia: Postsurgical she was transfused 2 units of packed red blood cells. Hemoglobin today is 9.4.  Discharge Instructions  Discharge Instructions    Diet - low sodium heart healthy   Complete by:  As directed    Increase activity slowly   Complete by:  As directed    Weight bearing as tolerated   Complete by:  As directed      Allergies as of 03/08/2018      Reactions   Drisdol [ergocalciferol] Rash   Gluten Meal Other (See Comments)   Causes extreme intestinal pain   Latex Itching, Rash      Medication List    STOP taking these medications   PROBIOTIC PO     TAKE these medications   BAYER BACK  & BODY PAIN EX ST 500-32.5 MG Tabs Generic drug:  Aspirin-Caffeine Take 2 tablets by mouth 2 (two) times daily as needed (pain).   betamethasone dipropionate 0.05 % cream Commonly known as:  DIPROLENE Apply topically 2 (two) times daily.   docusate sodium 100 MG capsule Commonly known as:  COLACE Take 1 capsule (100 mg total) by mouth 2 (two) times daily.   enoxaparin 40 MG/0.4ML injection Commonly known as:  LOVENOX Inject 0.4 mLs (40 mg total) into the skin daily.   GOLD BOND ECZEMA RELIEF EX Apply 1 application topically 2 (two) times daily as needed (eczema/itching).   HAIR/SKIN/NAILS/BIOTIN Tabs Take 2 tablets by mouth daily.   metFORMIN 500 MG tablet Commonly known as:  GLUCOPHAGE Take 1 tablet (500 mg total) by mouth 2 (two) times daily with a meal.   multivitamin with minerals Tabs tablet Take 1 tablet by mouth daily.   oxyCODONE-acetaminophen 5-325 MG tablet Commonly known as:  PERCOCET Take 1-2 tablets by mouth every 6 (six) hours as needed for severe pain.   polyethylene glycol packet Commonly known as:  MIRALAX / GLYCOLAX Take 17 g by mouth 2 (two) times daily for 2 days.   sulfamethoxazole-trimethoprim 800-160 MG tablet Commonly known as:  BACTRIM DS,SEPTRA DS Take 1 tablet by mouth 2 (two) times daily.            Discharge Care Instructions  (From admission, onward)         Start     Ordered   03/06/18 0000  Weight bearing as tolerated  03/06/18 1655         Follow-up Information    Leandrew Koyanagi, MD In 2 weeks.   Specialty:  Orthopedic Surgery Why:  For suture removal, For wound re-check Contact information: [place] [address] [phone]        [facility]   Specialty:  [place] Why:  A representative from [facility] will contact you to arrange start date and time for your therapy. Contact information: [address] [place]  [address] [phone]          Allergies  Allergen Reactions  . Drisdol [Ergocalciferol] Rash  . Gluten Meal Other (See Comments)    Causes extreme intestinal pain  . Latex Itching and Rash    Consultations:  Orthopedic   Procedures/Studies: Dg Tibia/fibula Left  Result Date: 03/05/2018 CLINICAL DATA:  Fall, left hip pain EXAM: LEFT TIBIA AND FIBULA - 2 VIEW COMPARISON:  None. FINDINGS: There is no evidence of fracture or other focal bone lesions. Soft tissues are unremarkable. IMPRESSION: Negative. Electronically Signed   By: Rolm Baptise M.D.   On: 03/05/2018 16:23   Pelvis Portable  Result Date: 03/06/2018 CLINICAL DATA:  73 year old female. Post left hip replacement. Subsequent encounter. EXAM: PORTABLE PELVIS 1-2 VIEWS COMPARISON:  Intraoperative exam same date. FINDINGS: Post total left hip replacement which appears in satisfactory position without complication noted on frontal projection. IMPRESSION: Post left hip replacement. Electronically Signed   By: Genia Del M.D.   On: 03/06/2018 17:35   Dg Hand Complete Left  Result Date: 03/05/2018 CLINICAL DATA:  Fall EXAM: LEFT HAND - COMPLETE 3+ VIEW COMPARISON:  None. FINDINGS: Degenerative changes at the 1st carpometacarpal joint. No acute bony abnormality. Specifically, no fracture, subluxation, or dislocation. IMPRESSION: No acute bony abnormality. Electronically Signed   By: Rolm Baptise M.D.   On: 03/05/2018 16:22   Dg C-arm 1-60 Min  Result Date: 03/06/2018 CLINICAL DATA:  73 year old female post left hip replacement. Initial encounter. EXAM: OPERATIVE left HIP (WITH PELVIS IF PERFORMED) 2 VIEWS TECHNIQUE: Fluoroscopic spot image(s) were submitted for interpretation post-operatively. Fluoroscopic time: Not provided. COMPARISON:  None. FINDINGS: Two intraoperative C-arm views of the left hip submitted for review after surgery. This reveals total left hip replacement in satisfactory position on frontal imaging. IMPRESSION:  Post total left hip replacement. Electronically Signed   By: Genia Del M.D.   On: 03/06/2018 17:33   Dg Hip Operative Unilat W Or W/o Pelvis Left  Result Date: 03/06/2018 CLINICAL DATA:  73 year old female post left hip replacement. Initial encounter. EXAM: OPERATIVE left HIP (WITH PELVIS IF PERFORMED) 2 VIEWS TECHNIQUE: Fluoroscopic spot image(s) were submitted for interpretation post-operatively. Fluoroscopic time: Not provided. COMPARISON:  None. FINDINGS: Two intraoperative C-arm views of the left hip submitted for review after surgery. This reveals total left hip replacement in satisfactory position on frontal imaging. IMPRESSION: Post total left hip replacement. Electronically Signed   By: Genia Del M.D.   On: 03/06/2018 17:33   Dg Hip Unilat With Pelvis 2-3 Views Left  Result Date: 03/05/2018 CLINICAL DATA:  Fall, left hip pain EXAM: DG HIP (WITH OR WITHOUT PELVIS) 2-3V LEFT COMPARISON:  None. FINDINGS: There is a left femoral neck fracture with varus angulation. No subluxation or dislocation. No additional acute bony abnormality. IMPRESSION: Left femoral neck fracture with varus angulation. Electronically Signed   By: Rolm Baptise M.D.   On: 03/05/2018 16:21    Subjective:  No complaints feels great. Discharge Exam: Vitals:   03/07/18 2015 03/08/18 0501  BP: 134/70 140/76  Pulse: 98 (!) 107  Resp: 14 15  Temp: 99.3 F (37.4 C) 99.7 F (37.6 C)  SpO2: 100% 92%     General: Pt is alert, awake, not in acute distress Cardiovascular: RRR, S1/S2 +, no rubs, no gallops Respiratory: CTA bilaterally, no wheezing, no rhonchi Abdominal: Soft, NT, ND, bowel sounds + Extremities: no edema, no cyanosis    The results of significant diagnostics from this hospitalization (including imaging, microbiology, ancillary and laboratory) are listed below for reference.     Microbiology: Recent Results (from the past 240 hour(s))  MRSA PCR Screening     Status: None   Collection Time:  03/05/18  8:36 PM  Result Value Ref Range Status   MRSA by PCR NEGATIVE NEGATIVE Final    Comment:        The GeneXpert MRSA Assay (FDA approved for NASAL specimens only), is one component of a comprehensive MRSA colonization surveillance program. It is not intended to diagnose MRSA infection nor to guide or monitor treatment for MRSA infections. Performed at [facility], [place] [address], [place], [place] [address]      Labs: BNP (last 3 results) No results for input(s): BNP in the last 8760 hours. Basic Metabolic Panel: Recent Labs  Lab 03/05/18 1635 03/06/18 0229 03/06/18 1653 03/07/18 0325 03/08/18 0238  NA 138 139 138 134* 135  K 4.5 3.8 3.8 3.8 3.6  CL 110 109  --  105 104  CO2 23 20*  --  22 22  GLUCOSE 166* 141* 144* 162* 165*  BUN 11 9  --  6* 6*  CREATININE 0.87 0.70  --  0.69 0.69  CALCIUM 9.4 9.0  --  9.0 9.1   Liver Function Tests: Recent Labs  Lab 03/08/18 0238  AST 26  ALT 17  ALKPHOS 65  BILITOT 1.1  PROT 5.8*  ALBUMIN 2.8*   No results for input(s): LIPASE, AMYLASE in the last 168 hours. No results for input(s): AMMONIA in the last 168 hours. CBC: Recent Labs  Lab 03/05/18 1635 03/06/18 0229 03/06/18 1653 03/07/18 0325 03/08/18 0238  WBC 9.4 6.9  --  8.5 9.2  NEUTROABS 7.8*  --   --   --   --   HGB 9.0* 8.5* 8.8* 7.2* 9.4*  HCT 31.8* 30.3* 26.0* 24.5* 30.7*  MCV 70.2* 68.2*  --  67.1* 71.2*  PLT 284 268  --  215 221   Cardiac Enzymes: No results for input(s): CKTOTAL, CKMB, CKMBINDEX, TROPONINI in the last 168 hours. BNP: Invalid input(s): POCBNP CBG: Recent Labs  Lab 03/07/18 1141 03/07/18 1743 03/07/18 2116 03/08/18 0723 03/08/18 1113  GLUCAP 158* 125* 144* 139* 203*   D-Dimer No results for input(s): DDIMER in the last 72 hours. Hgb A1c No results for input(s): HGBA1C in the last 72 hours. Lipid Profile No results for input(s): CHOL, HDL, LDLCALC, TRIG, CHOLHDL, LDLDIRECT in the last 72 hours. Thyroid  function studies No results for input(s): TSH, T4TOTAL, T3FREE, THYROIDAB in the last 72 hours.  Invalid input(s): FREET3 Anemia work up No results for input(s): VITAMINB12, FOLATE, FERRITIN, TIBC, IRON, RETICCTPCT in the last 72 hours. Urinalysis    Component Value Date/Time   COLORURINE YELLOW 01/29/2012 1447   APPEARANCEUR CLOUDY (A) 01/29/2012 1447   LABSPEC 1.010 01/29/2012 1447   PHURINE 5.5 01/29/2012 1447   GLUCOSEU NEGATIVE 01/29/2012 1447   HGBUR NEGATIVE  01/29/2012 [place] 01/29/2012 1447   KETONESUR NEGATIVE 01/29/2012 1447   PROTEINUR NEGATIVE 01/29/2012 1447   UROBILINOGEN 0.2 01/29/2012 1447   NITRITE NEGATIVE 01/29/2012 1447   LEUKOCYTESUR NEGATIVE 01/29/2012 1447   Sepsis Labs Invalid input(s): PROCALCITONIN,  WBC,  LACTICIDVEN Microbiology Recent Results (from the past 240 hour(s))  MRSA PCR Screening     Status: None   Collection Time: 03/05/18  8:36 PM  Result Value Ref Range Status   MRSA by PCR NEGATIVE NEGATIVE Final    Comment:        The GeneXpert MRSA Assay (FDA approved for NASAL specimens only), is one component of a comprehensive MRSA colonization surveillance program. It is not intended to diagnose MRSA infection nor to guide or monitor treatment for MRSA infections. Performed at [facility], [place] [address] [place], [place] [address]      Time coordinating discharge: 40 minutes  SIGNED:   Charlynne Cousins, MD  [facility]

## 2018-03-08 NOTE — Progress Notes (Signed)
Provided discharge education/instructions, all questions and concerns addressed, Pt not in distress, refused to get flu vaccine prior to discharge, denies pain 0/10, equipment delivered to room. Pt stated she feels safe transferring and ambulating, she would have 24-hour supervision at home, son will be coming over on Friday to stay with her. Discharged home with belongings accompanied by friend.

## 2018-03-08 NOTE — Progress Notes (Signed)
Subjective: 2 Days Post-Op Procedure(s) (LRB): TOTAL HIP ARTHROPLASTY ANTERIOR APPROACH (Left) Patient reports pain as mild.  Feeling well this am.   Objective: Vital signs in last 24 hours: Temp:  [98.8 F (37.1 C)-100.2 F (37.9 C)] 99.7 F (37.6 C) (02/26 0501) Pulse Rate:  [98-110] 107 (02/26 0501) Resp:  [14-15] 15 (02/26 0501) BP: (112-151)/(61-83) 140/76 (02/26 0501) SpO2:  [92 %-100 %] 92 % (02/26 0501)  Intake/Output from previous day: 02/25 0701 - 02/26 0700 In: 1444.3 [P.O.:480; I.V.:200; Blood:764.3] Out: -  Intake/Output this shift: No intake/output data recorded.  Recent Labs    03/05/18 1635 03/06/18 0229 03/06/18 1653 03/07/18 0325 03/08/18 0238  HGB 9.0* 8.5* 8.8* 7.2* 9.4*   Recent Labs    03/07/18 0325 03/08/18 0238  WBC 8.5 9.2  RBC 3.65* 4.31  HCT 24.5* 30.7*  PLT 215 221   Recent Labs    03/07/18 0325 03/08/18 0238  NA 134* 135  K 3.8 3.6  CL 105 104  CO2 22 22  BUN 6* 6*  CREATININE 0.69 0.69  GLUCOSE 162* 165*  CALCIUM 9.0 9.1   Recent Labs    03/05/18 1635  INR 1.02    Neurologically intact Neurovascular intact Sensation intact distally Intact pulses distally Dorsiflexion/Plantar flexion intact Incision: dressing C/D/I No cellulitis present Compartment soft   Assessment/Plan: 2 Days Post-Op Procedure(s) (LRB): TOTAL HIP ARTHROPLASTY ANTERIOR APPROACH (Left) Up with therapy WBAT LLE Hgb trending up- patient asymptomatic D/c dispo per medicine F/u with Dr. Erlinda Hong 2 weeks post-op for suture removal     Aundra Dubin 03/08/2018, 7:17 AM

## 2018-03-08 NOTE — Care Management Note (Signed)
Case Management Note  Patient Details  Name: Stephanie Coleman MRN: [id number] Date of Birth: 1945/05/12  Subjective/Objective: 73 yr old female s/p left total hip arthroplasty.                   Action/Plan: Case manager spoke with patient concerning discharge plan and DME needs. Patient was preoperatively setup with [facility], no changes. She will have support of friends at discharge.   Expected Discharge Date:  03/08/18               Expected Discharge Plan:  [place]  In-House Referral:  NA  Discharge planning Services  CM Consult  Post Acute Care Choice:  Durable Medical Equipment, Home Health Choice offered to:  Patient  DME Arranged:  3-N-1, Walker rolling DME Agency:  [place]:  PT [place] Agency:  [facility] (formerly [facility])  Status of Service:  Completed, signed off  If discussed at [employer], dates discussed:    Additional Comments:  Ninfa Meeker, RN 03/08/2018, 1:10 PM

## 2018-03-08 NOTE — Progress Notes (Signed)
Occupational Therapy Treatment Patient Details Name: Stephanie Coleman MRN: [id number] DOB: 08-19-1945 Today's Date: 03/08/2018    History of present illness 73 y.o. female with a history of diet-controlled diabetes, hyperlipidemia.  Patient seen for pain in the left hip after a mechanical fall.  She stepped off a 2 foot platform landing mostly on her left foot and had immediate pain in the left hip. s/p L THA direct anterior approach 03/06/18. PMH:DDD, DM controlled by diet   OT comments  Pt progressing towards OT goals this session, able to complete toilet transfers, peri care, standing grooming at min guard. Pt eager to please and required cues for safety. Pt dressed at end of session, requires mod A for LB ADL at this time. HHOT is essential for safety and to practice ADL and IADL in home setting.    Follow Up Recommendations  Home health OT;Supervision - Intermittent    Equipment Recommendations  3 in 1 bedside commode    Recommendations for Other Services      Precautions / Restrictions Precautions Precautions: Fall Restrictions Weight Bearing Restrictions: Yes LLE Weight Bearing: Weight bearing as tolerated       Mobility Bed Mobility               General bed mobility comments: OOB in recliner on entry   Transfers Overall transfer level: Needs assistance Equipment used: Rolling walker (2 wheeled) Transfers: Sit to/from Stand Sit to Stand: Min guard         General transfer comment: min guard for safety, good hand placement for powerup     Balance Overall balance assessment: Needs assistance Sitting-balance support: No upper extremity supported;Feet supported Sitting balance-Leahy Scale: Good     Standing balance support: During functional activity;No upper extremity supported Standing balance-Leahy Scale: Fair Standing balance comment: able to statically stand without UE support                           ADL either performed or assessed with  clinical judgement   ADL Overall ADL's : Needs assistance/impaired     Grooming: Wash/dry hands;Oral care;Wash/dry face;Min guard;Standing Grooming Details (indicate cue type and reason): sink level         Upper Body Dressing : Set up;Sitting   Lower Body Dressing: Moderate assistance;Sit to/from stand Lower Body Dressing Details (indicate cue type and reason): to don underwear, socks, pants, shoes Toilet Transfer: Min guard;Ambulation;RW;Grab bars Toilet Transfer Details (indicate cue type and reason): vc for safe hand placement Toileting- Clothing Manipulation and Hygiene: Min guard;Sit to/from stand       Functional mobility during ADLs: Min guard;Rolling walker;Cueing for safety       Vision       Perception     Praxis      Cognition Arousal/Alertness: Awake/alert Behavior During Therapy: WFL for tasks assessed/performed Overall Cognitive Status: Within Functional Limits for tasks assessed                                 General Comments: Pt very motivated to improve, which makes her semi-impulsive/decreased safety awareness in her eagerness to perform        Exercises     Shoulder Instructions       General Comments      Pertinent Vitals/ Pain       Pain Assessment: 0-10 Pain Score: 3  Pain Location: L hip  with wt bearing, mobility Pain Descriptors / Indicators: Discomfort;Sore Pain Intervention(s): Monitored during session;Repositioned  Home Living                                          Prior Functioning/Environment              Frequency  Min 2X/week        Progress Toward Goals  OT Goals(current goals can now be found in the care plan section)  Progress towards OT goals: Progressing toward goals  Acute Rehab OT Goals Patient Stated Goal: wants to return home to her cat OT Goal Formulation: With patient Time For Goal Achievement: 03/21/18 Potential to Achieve Goals: Good  Plan Frequency  remains appropriate;Discharge plan needs to be updated    Co-evaluation                 AM-PAC OT "6 Clicks" Daily Activity     Outcome Measure   Help from another person eating meals?: None Help from another person taking care of personal grooming?: A Little Help from another person toileting, which includes using toliet, bedpan, or urinal?: A Little Help from another person bathing (including washing, rinsing, drying)?: A Little Help from another person to put on and taking off regular upper body clothing?: None Help from another person to put on and taking off regular lower body clothing?: A Lot 6 Click Score: 19    End of Session Equipment Utilized During Treatment: Gait belt;Rolling walker  OT Visit Diagnosis: Other abnormalities of gait and mobility (R26.89);Muscle weakness (generalized) (M62.81)   Activity Tolerance Patient tolerated treatment well   Patient Left in chair;with call bell/phone within reach;with chair alarm set   Nurse Communication Mobility status        Time: 1340-1407 OT Time Calculation (min): 27 min  Charges: OT General Charges $OT Visit: 1 Visit OT Treatments $Self Care/Home Management : 23-37 mins  Hulda Humphrey OTR/L Acute Rehabilitation Services Pager: [phone] Office: [place] 03/08/2018, 3:19 PM

## 2018-03-10 ENCOUNTER — Telehealth (INDEPENDENT_AMBULATORY_CARE_PROVIDER_SITE_OTHER): Payer: Self-pay | Admitting: Orthopaedic Surgery

## 2018-03-10 DIAGNOSIS — Z7901 Long term (current) use of anticoagulants: Secondary | ICD-10-CM | POA: Diagnosis not present

## 2018-03-10 DIAGNOSIS — S72002D Fracture of unspecified part of neck of left femur, subsequent encounter for closed fracture with routine healing: Secondary | ICD-10-CM | POA: Diagnosis not present

## 2018-03-10 DIAGNOSIS — E785 Hyperlipidemia, unspecified: Secondary | ICD-10-CM | POA: Diagnosis not present

## 2018-03-10 DIAGNOSIS — E119 Type 2 diabetes mellitus without complications: Secondary | ICD-10-CM | POA: Diagnosis not present

## 2018-03-10 DIAGNOSIS — Z9181 History of falling: Secondary | ICD-10-CM | POA: Diagnosis not present

## 2018-03-10 DIAGNOSIS — Z96642 Presence of left artificial hip joint: Secondary | ICD-10-CM | POA: Diagnosis not present

## 2018-03-10 NOTE — Telephone Encounter (Signed)
Received voicemail message from [facility]) with [facility] needing verbal orders for HHPT 1 Wk 1, 3 Wk 1 and 1 Wk 1. The number to contact Anda Kraft is [phone]

## 2018-03-10 NOTE — Telephone Encounter (Signed)
Verbal order left on VM  

## 2018-03-13 DIAGNOSIS — Z96642 Presence of left artificial hip joint: Secondary | ICD-10-CM | POA: Diagnosis not present

## 2018-03-13 DIAGNOSIS — Z9181 History of falling: Secondary | ICD-10-CM | POA: Diagnosis not present

## 2018-03-13 DIAGNOSIS — S72002D Fracture of unspecified part of neck of left femur, subsequent encounter for closed fracture with routine healing: Secondary | ICD-10-CM | POA: Diagnosis not present

## 2018-03-13 DIAGNOSIS — Z7901 Long term (current) use of anticoagulants: Secondary | ICD-10-CM | POA: Diagnosis not present

## 2018-03-13 DIAGNOSIS — E785 Hyperlipidemia, unspecified: Secondary | ICD-10-CM | POA: Diagnosis not present

## 2018-03-13 DIAGNOSIS — E119 Type 2 diabetes mellitus without complications: Secondary | ICD-10-CM | POA: Diagnosis not present

## 2018-03-15 DIAGNOSIS — Z9181 History of falling: Secondary | ICD-10-CM | POA: Diagnosis not present

## 2018-03-15 DIAGNOSIS — E785 Hyperlipidemia, unspecified: Secondary | ICD-10-CM | POA: Diagnosis not present

## 2018-03-15 DIAGNOSIS — S72002D Fracture of unspecified part of neck of left femur, subsequent encounter for closed fracture with routine healing: Secondary | ICD-10-CM | POA: Diagnosis not present

## 2018-03-15 DIAGNOSIS — Z7901 Long term (current) use of anticoagulants: Secondary | ICD-10-CM | POA: Diagnosis not present

## 2018-03-15 DIAGNOSIS — Z96642 Presence of left artificial hip joint: Secondary | ICD-10-CM | POA: Diagnosis not present

## 2018-03-15 DIAGNOSIS — E119 Type 2 diabetes mellitus without complications: Secondary | ICD-10-CM | POA: Diagnosis not present

## 2018-03-17 DIAGNOSIS — Z9181 History of falling: Secondary | ICD-10-CM | POA: Diagnosis not present

## 2018-03-17 DIAGNOSIS — Z96642 Presence of left artificial hip joint: Secondary | ICD-10-CM | POA: Diagnosis not present

## 2018-03-17 DIAGNOSIS — Z7901 Long term (current) use of anticoagulants: Secondary | ICD-10-CM | POA: Diagnosis not present

## 2018-03-17 DIAGNOSIS — S72002D Fracture of unspecified part of neck of left femur, subsequent encounter for closed fracture with routine healing: Secondary | ICD-10-CM | POA: Diagnosis not present

## 2018-03-17 DIAGNOSIS — E119 Type 2 diabetes mellitus without complications: Secondary | ICD-10-CM | POA: Diagnosis not present

## 2018-03-17 DIAGNOSIS — E785 Hyperlipidemia, unspecified: Secondary | ICD-10-CM | POA: Diagnosis not present

## 2018-03-20 DIAGNOSIS — E785 Hyperlipidemia, unspecified: Secondary | ICD-10-CM | POA: Diagnosis not present

## 2018-03-20 DIAGNOSIS — Z7901 Long term (current) use of anticoagulants: Secondary | ICD-10-CM | POA: Diagnosis not present

## 2018-03-20 DIAGNOSIS — E119 Type 2 diabetes mellitus without complications: Secondary | ICD-10-CM | POA: Diagnosis not present

## 2018-03-20 DIAGNOSIS — Z9181 History of falling: Secondary | ICD-10-CM | POA: Diagnosis not present

## 2018-03-20 DIAGNOSIS — S72002D Fracture of unspecified part of neck of left femur, subsequent encounter for closed fracture with routine healing: Secondary | ICD-10-CM | POA: Diagnosis not present

## 2018-03-20 DIAGNOSIS — Z96642 Presence of left artificial hip joint: Secondary | ICD-10-CM | POA: Diagnosis not present

## 2018-03-21 ENCOUNTER — Encounter (INDEPENDENT_AMBULATORY_CARE_PROVIDER_SITE_OTHER): Payer: Self-pay | Admitting: Orthopaedic Surgery

## 2018-03-21 ENCOUNTER — Ambulatory Visit (INDEPENDENT_AMBULATORY_CARE_PROVIDER_SITE_OTHER): Payer: Medicare HMO | Admitting: Orthopaedic Surgery

## 2018-03-21 ENCOUNTER — Ambulatory Visit (INDEPENDENT_AMBULATORY_CARE_PROVIDER_SITE_OTHER): Payer: Medicare HMO

## 2018-03-21 DIAGNOSIS — M25552 Pain in left hip: Secondary | ICD-10-CM

## 2018-03-21 DIAGNOSIS — S72002A Fracture of unspecified part of neck of left femur, initial encounter for closed fracture: Secondary | ICD-10-CM

## 2018-03-21 NOTE — Progress Notes (Signed)
   Post-Op Visit Note   Patient: Stephanie Coleman           Date of Birth: 08-15-1945           MRN: [id number] Visit Date: 03/21/2018 PCP: Patient, No Pcp Per   Assessment & Plan:  Chief Complaint:  Chief Complaint  Patient presents with  . Left Hip - Pain, Routine Post Op   Visit Diagnoses:  1. Closed displaced fracture of left femoral neck (HCC)   2. Pain in left hip     Plan: Stephanie Coleman is two-week status post left femoral neck fracture and total hip replacement.  She is doing really well.  She was able to discharge home.  She has already completed home health physical therapy and is currently walking with a cane.  Reports no pain.  She is doing really well and recovering very quickly.  Her surgical incisions fully healed.  Sutures were removed and Steri-Strips were applied today.  Ice and heat to the small seroma.  From my standpoint she is doing really well.  She may drive at this point as she is recovered quite a bit.  I will see her back in 4 weeks with two-view x-rays of the left hip.  Follow-Up Instructions: Return in about 4 weeks (around 04/18/2018).   Orders:  Orders Placed This Encounter  Procedures  . XR HIP UNILAT W OR W/O PELVIS 2-3 VIEWS LEFT   No orders of the defined types were placed in this encounter.   Imaging: Xr Hip Unilat W Or W/o Pelvis 2-3 Views Left  Result Date: 03/21/2018 Stable left total hip replacement without complication.   PMFS History: Patient Active Problem List   Diagnosis Date Noted  . Closed displaced fracture of left femoral neck ([employer]) 03/05/2018  . Diet-controlled diabetes mellitus ([place]) 03/05/2018  . Intra-abdominal abscess ([place]) 01/30/2012  . Intra-abdominal fluid collection 01/29/2012   Past Medical History:  Diagnosis Date  . DDD (degenerative disc disease), lumbar   . Depression   . Diabetes mellitus without complication (HCC)    diet controlled  . Hyperlipidemia    pt states borderline  . Obesity     History reviewed. No  pertinent family history.  Past Surgical History:  Procedure Laterality Date  . BACK SURGERY    . CHOLECYSTECTOMY    . colorectal surgery    . TOTAL HIP ARTHROPLASTY Left 03/06/2018   Procedure: TOTAL HIP ARTHROPLASTY ANTERIOR APPROACH;  Surgeon: Leandrew Koyanagi, MD;  Location: [facility];  Service: Orthopedics;  Laterality: Left;   Social History   Occupational History  . Not on file  Tobacco Use  . Smoking status: Never Smoker  . Smokeless tobacco: Never Used  Substance and Sexual Activity  . Alcohol use: No  . Drug use: No  . Sexual activity: Never

## 2018-04-17 ENCOUNTER — Telehealth (INDEPENDENT_AMBULATORY_CARE_PROVIDER_SITE_OTHER): Payer: Self-pay | Admitting: Radiology

## 2018-04-17 NOTE — Telephone Encounter (Signed)
Left message for patient to return call. Please ask screening questions, thank you!

## 2018-04-17 NOTE — Telephone Encounter (Signed)
Patient called back and answered no to all screening questions. 

## 2018-04-18 ENCOUNTER — Encounter (INDEPENDENT_AMBULATORY_CARE_PROVIDER_SITE_OTHER): Payer: Self-pay | Admitting: Physician Assistant

## 2018-04-18 ENCOUNTER — Other Ambulatory Visit: Payer: Self-pay

## 2018-04-18 ENCOUNTER — Ambulatory Visit (INDEPENDENT_AMBULATORY_CARE_PROVIDER_SITE_OTHER): Payer: Medicare HMO | Admitting: Physician Assistant

## 2018-04-18 ENCOUNTER — Ambulatory Visit (INDEPENDENT_AMBULATORY_CARE_PROVIDER_SITE_OTHER): Payer: Medicare HMO

## 2018-04-18 DIAGNOSIS — Z96642 Presence of left artificial hip joint: Secondary | ICD-10-CM | POA: Insufficient documentation

## 2018-04-18 NOTE — Progress Notes (Signed)
   Post-Op Visit Note   Patient: Stephanie Coleman           Date of Birth: 02-21-1945           MRN: [id number] Visit Date: 04/18/2018 PCP: Patient, No Pcp Per   Assessment & Plan:  Chief Complaint:  Chief Complaint  Patient presents with  . Left Hip - Pain   Visit Diagnoses:  1. Status post total hip replacement, left     Plan: Patient is a pleasant 73 year old female who presents our clinic today 43 days status post left total hip replacement from a femoral neck fracture, date of surgery 03/06/2018.  She has been doing excellent.  She has regained near full range of motion and strength.  Work on home exercise program.  The only issue she has a soreness to the left knee when trying to kneel or go up stairs.  Overall doing well and very pleased with her outcome.  Examination of the left hip reveals a fully healed surgical scar.  No evidence of infection.  Calf is soft nontender.  Near full range of motion and strength.  She is neurovascular intact distally.  At this point, she will continue working on strengthening exercises.  Dental prophylaxis reinforced.  Follow-up with [name] in 6 weeks time for repeat evaluation and x-rays.  Follow-Up Instructions: Return in about 6 weeks (around 05/30/2018).   Orders:  Orders Placed This Encounter  Procedures  . XR HIP UNILAT W OR W/O PELVIS 2-3 VIEWS LEFT   No orders of the defined types were placed in this encounter.   Imaging: Xr Hip Unilat W Or W/o Pelvis 2-3 Views Left  Result Date: 04/18/2018 X-rays of the left hip reveal a well-seated prosthesis without complication   PMFS History: Patient Active Problem List   Diagnosis Date Noted  . Status post total hip replacement, left 04/18/2018  . Closed displaced fracture of left femoral neck ([place]) 03/05/2018  . Diet-controlled diabetes mellitus ([place]) 03/05/2018  . Intra-abdominal abscess ([place]) 01/30/2012  . Intra-abdominal fluid collection 01/29/2012   Past Medical History:  Diagnosis Date  .  DDD (degenerative disc disease), lumbar   . Depression   . Diabetes mellitus without complication (HCC)    diet controlled  . Hyperlipidemia    pt states borderline  . Obesity     History reviewed. No pertinent family history.  Past Surgical History:  Procedure Laterality Date  . BACK SURGERY    . CHOLECYSTECTOMY    . colorectal surgery    . TOTAL HIP ARTHROPLASTY Left 03/06/2018   Procedure: TOTAL HIP ARTHROPLASTY ANTERIOR APPROACH;  Surgeon: Leandrew Koyanagi, MD;  Location: [facility];  Service: Orthopedics;  Laterality: Left;   Social History   Occupational History  . Not on file  Tobacco Use  . Smoking status: Never Smoker  . Smokeless tobacco: Never Used  Substance and Sexual Activity  . Alcohol use: No  . Drug use: No  . Sexual activity: Never

## 2018-05-30 ENCOUNTER — Other Ambulatory Visit: Payer: Self-pay

## 2018-05-30 ENCOUNTER — Encounter: Payer: Self-pay | Admitting: Orthopaedic Surgery

## 2018-05-30 ENCOUNTER — Ambulatory Visit: Payer: Medicare HMO | Admitting: Orthopaedic Surgery

## 2018-05-30 ENCOUNTER — Ambulatory Visit (INDEPENDENT_AMBULATORY_CARE_PROVIDER_SITE_OTHER): Payer: Medicare HMO

## 2018-05-30 DIAGNOSIS — Z96642 Presence of left artificial hip joint: Secondary | ICD-10-CM

## 2018-05-30 MED ORDER — CLOBETASOL PROPIONATE 0.05 % EX OINT: 1.0000 "application " | TOPICAL_OINTMENT | Freq: Two times a day (BID) | CUTANEOUS | 2 refills | Status: DC

## 2018-05-30 NOTE — Progress Notes (Signed)
   Post-Op Visit Note   Patient: Stephanie Coleman           Date of Birth: [birth year]           MRN: [id number] Visit Date: 05/30/2018 PCP: Patient, No Pcp Per   Assessment & Plan:  Chief Complaint:  Chief Complaint  Patient presents with  . Left Hip - Pain, Follow-up, Routine Post Op   Visit Diagnoses:  1. Status post total hip replacement, left     Plan: Stephanie Coleman is a 73 year old female who is nearly 3 months status post left total hip replacement for femoral neck fracture.  She is doing really well and reports no pain.  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  Her scar is fully healed.  She has normal hip range of motion without pain.  She is able to walk completely normally.  From my standpoint she is doing really well.  Dental prophylaxis was reinforced.  I will see her back in 3 months for her 7-month checkup.  She will need standing AP pelvis and lateral hip on return.  Follow-Up Instructions: Return in about 3 months (around 08/30/2018).   Orders:  Orders Placed This Encounter  Procedures  . XR HIP UNILAT W OR W/O PELVIS 2-3 VIEWS LEFT   Meds ordered this encounter  Medications  . clobetasol ointment (TEMOVATE) 0.05 %    Sig: Apply 1 application topically 2 (two) times daily.    Dispense:  60 g    Refill:  2    Imaging: Xr Hip Unilat W Or W/o Pelvis 2-3 Views Left  Result Date: 05/30/2018 Stable left total hip replacement without complication.   PMFS History: Patient Active Problem List   Diagnosis Date Noted  . Status post total hip replacement, left 04/18/2018  . Closed displaced fracture of left femoral neck ([place]) 03/05/2018  . Diet-controlled diabetes mellitus ([place]) 03/05/2018  . Intra-abdominal abscess ([place]) 01/30/2012  . Intra-abdominal fluid collection 01/29/2012   Past Medical History:  Diagnosis Date  . DDD (degenerative disc disease), lumbar   . Depression   . Diabetes mellitus without complication (HCC)    diet controlled  .  Hyperlipidemia    pt states borderline  . Obesity     History reviewed. No pertinent family history.  Past Surgical History:  Procedure Laterality Date  . BACK SURGERY    . CHOLECYSTECTOMY    . colorectal surgery    . TOTAL HIP ARTHROPLASTY Left 03/06/2018   Procedure: TOTAL HIP ARTHROPLASTY ANTERIOR APPROACH;  Surgeon: Leandrew Koyanagi, MD;  Location: [facility];  Service: Orthopedics;  Laterality: Left;   Social History   Occupational History  . Not on file  Tobacco Use  . Smoking status: Never Smoker  . Smokeless tobacco: Never Used  Substance and Sexual Activity  . Alcohol use: No  . Drug use: No  . Sexual activity: Never

## 2018-06-29 ENCOUNTER — Telehealth: Payer: Self-pay | Admitting: Medical

## 2018-06-29 DIAGNOSIS — S72002A Fracture of unspecified part of neck of left femur, initial encounter for closed fracture: Secondary | ICD-10-CM

## 2018-06-29 DIAGNOSIS — Z13820 Encounter for screening for osteoporosis: Secondary | ICD-10-CM

## 2018-06-29 NOTE — Telephone Encounter (Signed)
Bone density order placed. Insurance sent report notifying me of fracture hip. Want to get bone density. Want her to get scheduled for virtual visit or telephone call.

## 2018-06-29 NOTE — Telephone Encounter (Signed)
Filled out [employer] form about ordering dexascan. Faxed back but pt not scheduled yet. Asking staff to contact pt. Notify her of test ordered and follow up with me virt visit to explain as well.  Gave form to [place] to fax.

## 2018-06-29 NOTE — Telephone Encounter (Signed)
[employer] form filled out about bone density. Will give to you. Please fax it.

## 2018-06-30 NOTE — Telephone Encounter (Signed)
Forms faxed today 06/30/2018

## 2018-07-04 ENCOUNTER — Telehealth: Payer: Self-pay | Admitting: Medical

## 2018-07-04 NOTE — Telephone Encounter (Signed)
Pt declined bone density.

## 2018-08-29 ENCOUNTER — Ambulatory Visit (INDEPENDENT_AMBULATORY_CARE_PROVIDER_SITE_OTHER): Payer: Medicare HMO

## 2018-08-29 ENCOUNTER — Ambulatory Visit (INDEPENDENT_AMBULATORY_CARE_PROVIDER_SITE_OTHER): Payer: Medicare HMO | Admitting: Orthopaedic Surgery

## 2018-08-29 ENCOUNTER — Encounter: Payer: Self-pay | Admitting: Orthopaedic Surgery

## 2018-08-29 DIAGNOSIS — Z96642 Presence of left artificial hip joint: Secondary | ICD-10-CM | POA: Diagnosis not present

## 2018-08-29 DIAGNOSIS — L719 Rosacea, unspecified: Secondary | ICD-10-CM | POA: Diagnosis not present

## 2018-08-29 NOTE — Progress Notes (Signed)
   Office Visit Note   Patient: Stephanie Coleman           Date of Birth: May 24, 1945           MRN: [id number] Visit Date: 08/29/2018              Requested by: No referring provider defined for this encounter. PCP: Mackie Pai, PA-C   Assessment & Plan: Visit Diagnoses:  1. Status post total hip replacement, left   2. Rosacea     Plan: Impression 6 months status post left total hip replacement.  Patient has done very well from her hip fracture.  I would like to see her back in another 6 months for her one-year check.  Dental prophylaxis was reinforced.  We will refer her to dermatology for rosacea per her request.  Follow-Up Instructions: Return in about 6 months (around 03/01/2019).   Orders:  Orders Placed This Encounter  Procedures  . XR HIP UNILAT W OR W/O PELVIS 2-3 VIEWS LEFT  . Ambulatory referral to Dermatology   No orders of the defined types were placed in this encounter.     Procedures: No procedures performed   Clinical Data: No additional findings.   Subjective: Chief Complaint  Patient presents with  . Left Hip - Follow-up    Stephanie Coleman is 6 months status post left total hip replacement for a femoral neck fracture.  She is doing really well has no pain or concerns.  She feels that she is even more active than before she had the hip fracture and hip replacement.   Review of Systems   Objective: Vital Signs: There were no vitals taken for this visit.  Physical Exam  Ortho Exam Left hip exam shows a fully healed surgical scar.  She is ambulating well with a normal gait. Specialty Comments:  No specialty comments available.  Imaging: Xr Hip Unilat W Or W/o Pelvis 2-3 Views Left  Result Date: 08/29/2018 Stable left total hip replacement without complication.    PMFS History: Patient Active Problem List   Diagnosis Date Noted  . Status post total hip replacement, left 04/18/2018  . Closed displaced fracture of left femoral neck ([place]) 03/05/2018   . Diet-controlled diabetes mellitus ([place]) 03/05/2018  . Intra-abdominal abscess ([place]) 01/30/2012  . Intra-abdominal fluid collection 01/29/2012   Past Medical History:  Diagnosis Date  . DDD (degenerative disc disease), lumbar   . Depression   . Diabetes mellitus without complication (HCC)    diet controlled  . Hyperlipidemia    pt states borderline  . Obesity     History reviewed. No pertinent family history.  Past Surgical History:  Procedure Laterality Date  . BACK SURGERY    . CHOLECYSTECTOMY    . colorectal surgery    . TOTAL HIP ARTHROPLASTY Left 03/06/2018   Procedure: TOTAL HIP ARTHROPLASTY ANTERIOR APPROACH;  Surgeon: Leandrew Koyanagi, MD;  Location: [facility];  Service: Orthopedics;  Laterality: Left;   Social History   Occupational History  . Not on file  Tobacco Use  . Smoking status: Never Smoker  . Smokeless tobacco: Never Used  Substance and Sexual Activity  . Alcohol use: No  . Drug use: No  . Sexual activity: Never

## 2018-11-21 DIAGNOSIS — L718 Other rosacea: Secondary | ICD-10-CM | POA: Diagnosis not present

## 2018-11-21 DIAGNOSIS — L2089 Other atopic dermatitis: Secondary | ICD-10-CM | POA: Diagnosis not present

## 2018-11-21 DIAGNOSIS — L738 Other specified follicular disorders: Secondary | ICD-10-CM | POA: Diagnosis not present

## 2019-02-01 ENCOUNTER — Ambulatory Visit: Payer: Medicare HMO | Attending: Internal Medicine

## 2019-02-01 DIAGNOSIS — Z23 Encounter for immunization: Secondary | ICD-10-CM | POA: Insufficient documentation

## 2019-02-01 NOTE — Progress Notes (Signed)
   Covid-19 Vaccination Clinic  Name:  Stephanie Coleman    MRN: [id number] DOB: 01-15-1945  02/01/2019  Stephanie Coleman was observed post Covid-19 immunization for 15 minutes without incidence. She was provided with Vaccine Information Sheet and instruction to access the V-Safe system.   Stephanie Coleman was instructed to call 911 with any severe reactions post vaccine: Marland Kitchen Difficulty breathing  . Swelling of your face and throat  . A fast heartbeat  . A bad rash all over your body  . Dizziness and weakness    Immunizations Administered    Name Date Dose VIS Date Route   Pfizer COVID-19 Vaccine 02/01/2019  8:51 AM 0.3 mL 12/22/2018 Intramuscular   Manufacturer: [place]   Lot: [id number]   [place]: [id number]

## 2019-02-16 DIAGNOSIS — L218 Other seborrheic dermatitis: Secondary | ICD-10-CM | POA: Diagnosis not present

## 2019-02-16 DIAGNOSIS — L718 Other rosacea: Secondary | ICD-10-CM | POA: Diagnosis not present

## 2019-02-16 DIAGNOSIS — L308 Other specified dermatitis: Secondary | ICD-10-CM | POA: Diagnosis not present

## 2019-02-16 DIAGNOSIS — L738 Other specified follicular disorders: Secondary | ICD-10-CM | POA: Diagnosis not present

## 2019-02-22 ENCOUNTER — Ambulatory Visit: Payer: Medicare HMO | Attending: Internal Medicine

## 2019-02-22 DIAGNOSIS — Z23 Encounter for immunization: Secondary | ICD-10-CM

## 2019-02-22 NOTE — Progress Notes (Signed)
   Covid-19 Vaccination Clinic  Name:  Stephanie Coleman    MRN: [id number] DOB: 02-Jan-1946  02/22/2019  Stephanie Coleman was observed post Covid-19 immunization for 15 minutes without incidence. She was provided with Vaccine Information Sheet and instruction to access the V-Safe system.   Stephanie Coleman was instructed to call 911 with any severe reactions post vaccine: Marland Kitchen Difficulty breathing  . Swelling of your face and throat  . A fast heartbeat  . A bad rash all over your body  . Dizziness and weakness    Immunizations Administered    Name Date Dose VIS Date Route   Pfizer COVID-19 Vaccine 02/22/2019  8:44 AM 0.3 mL 12/22/2018 Intramuscular   Manufacturer: [place]   Lot: [id number]   [place]: [id number]

## 2019-02-27 ENCOUNTER — Encounter: Payer: Self-pay | Admitting: Orthopaedic Surgery

## 2019-02-27 ENCOUNTER — Ambulatory Visit (INDEPENDENT_AMBULATORY_CARE_PROVIDER_SITE_OTHER): Payer: Medicare HMO

## 2019-02-27 ENCOUNTER — Other Ambulatory Visit: Payer: Self-pay

## 2019-02-27 ENCOUNTER — Ambulatory Visit: Payer: Medicare HMO | Admitting: Orthopaedic Surgery

## 2019-02-27 VITALS — Ht 63.0 in | Wt 180.0 lb

## 2019-02-27 DIAGNOSIS — Z96642 Presence of left artificial hip joint: Secondary | ICD-10-CM

## 2019-02-27 NOTE — Progress Notes (Signed)
   Office Visit Note   Patient: Stephanie Coleman           Date of Birth: 01/05/1946           MRN: [id number] Visit Date: 02/27/2019              Requested by: Mackie Pai, PA-C [address],  [place] [id number] PCP: Mackie Pai, PA-C   Assessment & Plan: Visit Diagnoses:  1. Status post total hip replacement, left     Plan: Stephanie Coleman is doing very well for her 1 year visit.  She has resumed all normal activities.  At this point we will see her back in another year for 2-year visit with standing AP pelvis and lateral hip x-rays on return.  Follow-Up Instructions: Return in about 1 year (around 02/27/2020).   Orders:  Orders Placed This Encounter  Procedures  . XR HIP UNILAT W OR W/O PELVIS 2-3 VIEWS LEFT   No orders of the defined types were placed in this encounter.     Procedures: No procedures performed   Clinical Data: No additional findings.   Subjective: Chief Complaint  Patient presents with  . Left Hip - Pain    Left THA 03/06/2018    Stephanie Coleman is 1 year status post left total hip replacement for femoral neck fracture.  She is doing well has no complaints.   Review of Systems   Objective: Vital Signs: Ht 5\' 3"  (1.6 m)   Wt 180 lb (81.6 kg)   BMI 31.89 kg/m   Physical Exam  Ortho Exam Left hip exam shows fully healed surgical scar.  She is ambulating well without any assistive devices or any pain or limp. Specialty Comments:  No specialty comments available.  Imaging: XR HIP UNILAT W OR W/O PELVIS 2-3 VIEWS LEFT  Result Date: 02/27/2019 Stable total hip replacement without complication    PMFS History: Patient Active Problem List   Diagnosis Date Noted  . Status post total hip replacement, left 04/18/2018  . Closed displaced fracture of left femoral neck ([place]) 03/05/2018  . Diet-controlled diabetes mellitus ([place]) 03/05/2018  . Intra-abdominal abscess ([place]) 01/30/2012  . Intra-abdominal fluid collection 01/29/2012   Past  Medical History:  Diagnosis Date  . DDD (degenerative disc disease), lumbar   . Depression   . Diabetes mellitus without complication (HCC)    diet controlled  . Hyperlipidemia    pt states borderline  . Obesity     History reviewed. No pertinent family history.  Past Surgical History:  Procedure Laterality Date  . BACK SURGERY    . CHOLECYSTECTOMY    . colorectal surgery    . TOTAL HIP ARTHROPLASTY Left 03/06/2018   Procedure: TOTAL HIP ARTHROPLASTY ANTERIOR APPROACH;  Surgeon: Leandrew Koyanagi, MD;  Location: [facility];  Service: Orthopedics;  Laterality: Left;   Social History   Occupational History  . Not on file  Tobacco Use  . Smoking status: Never Smoker  . Smokeless tobacco: Never Used  Substance and Sexual Activity  . Alcohol use: No  . Drug use: No  . Sexual activity: Never

## 2019-05-15 IMAGING — CR DG TIBIA/FIBULA 2V*L*
4 series · 4 of 4 positions shown · non-contrast
Comparison: None.

CLINICAL DATA: Fall, left hip pain

EXAM:
LEFT TIBIA AND FIBULA - 2 VIEW

[tibia ap (1 of 2)]
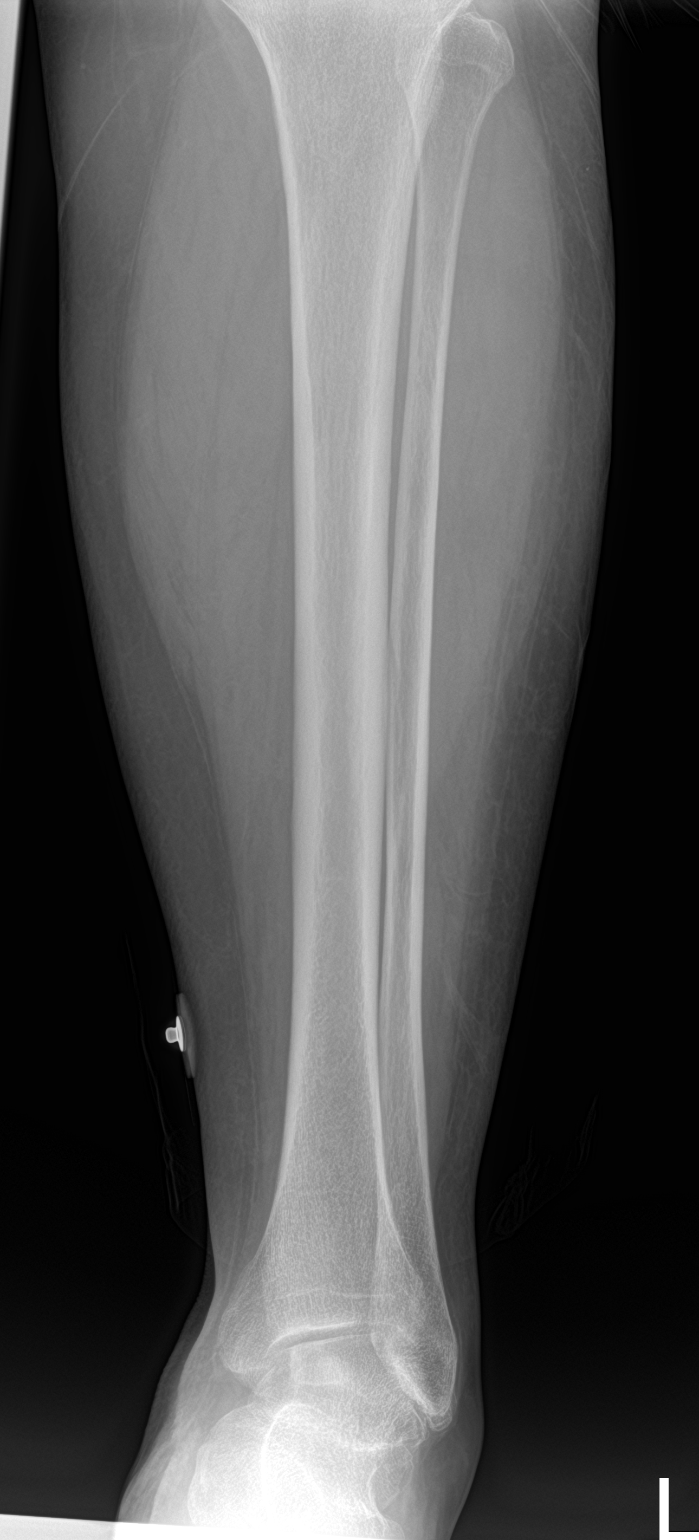

[tibia ap (2 of 2)]
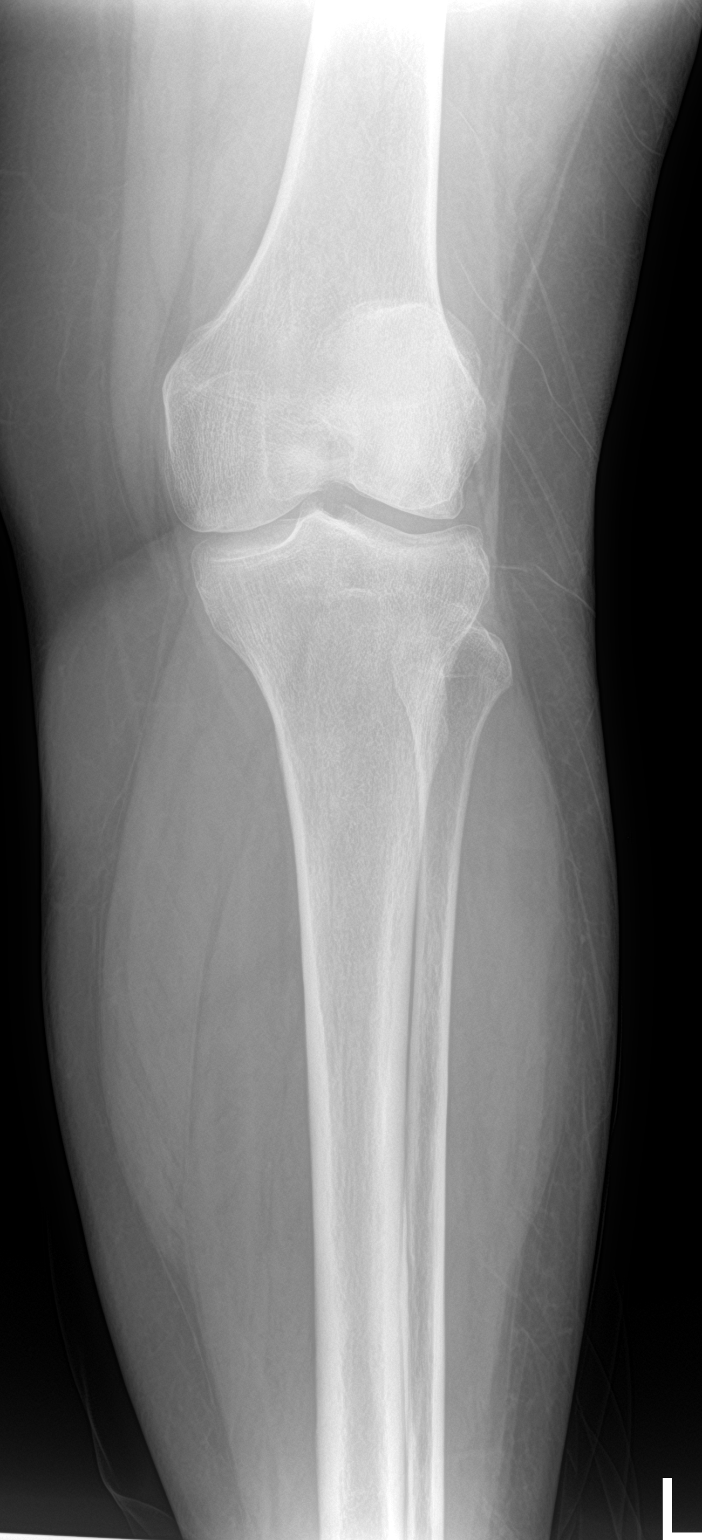

[tibia lat (1 of 2)]
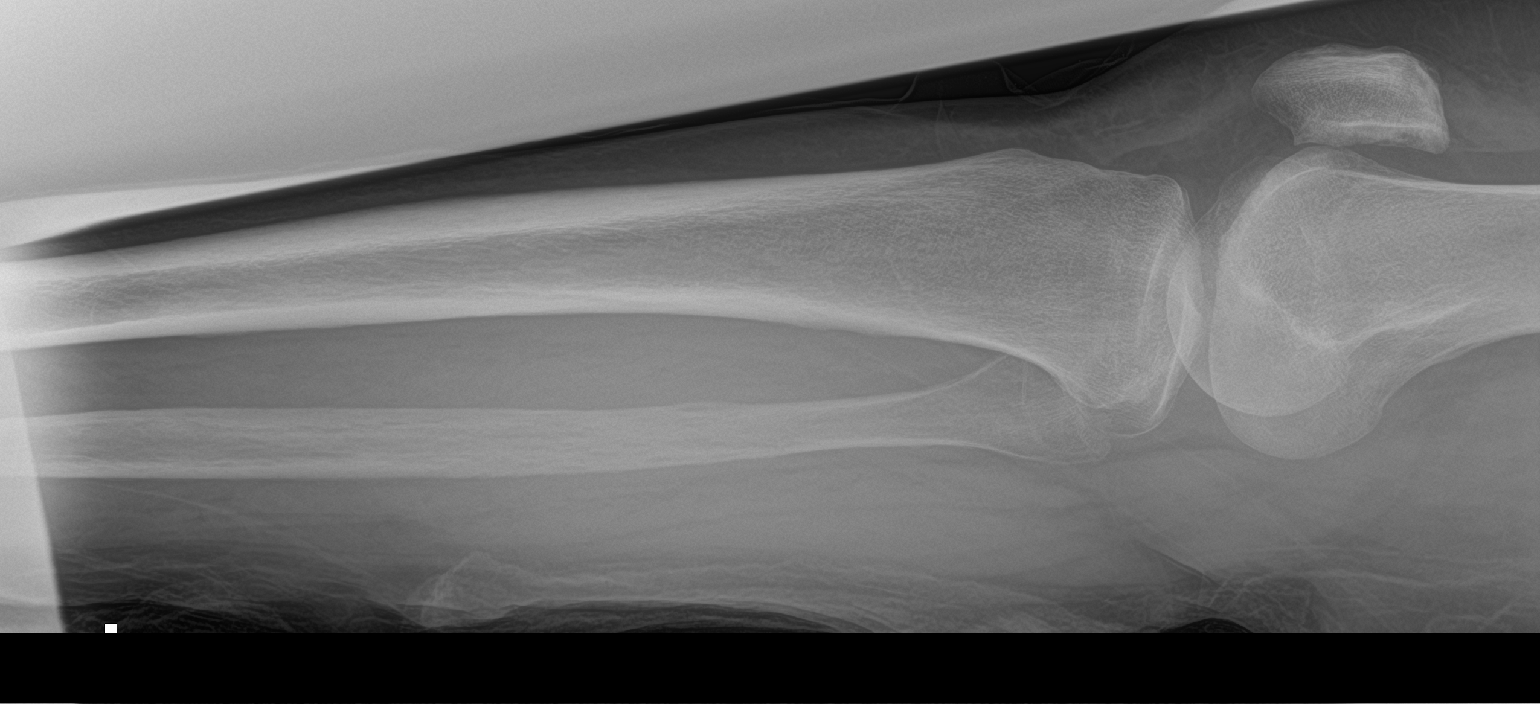

[tibia lat (2 of 2)]
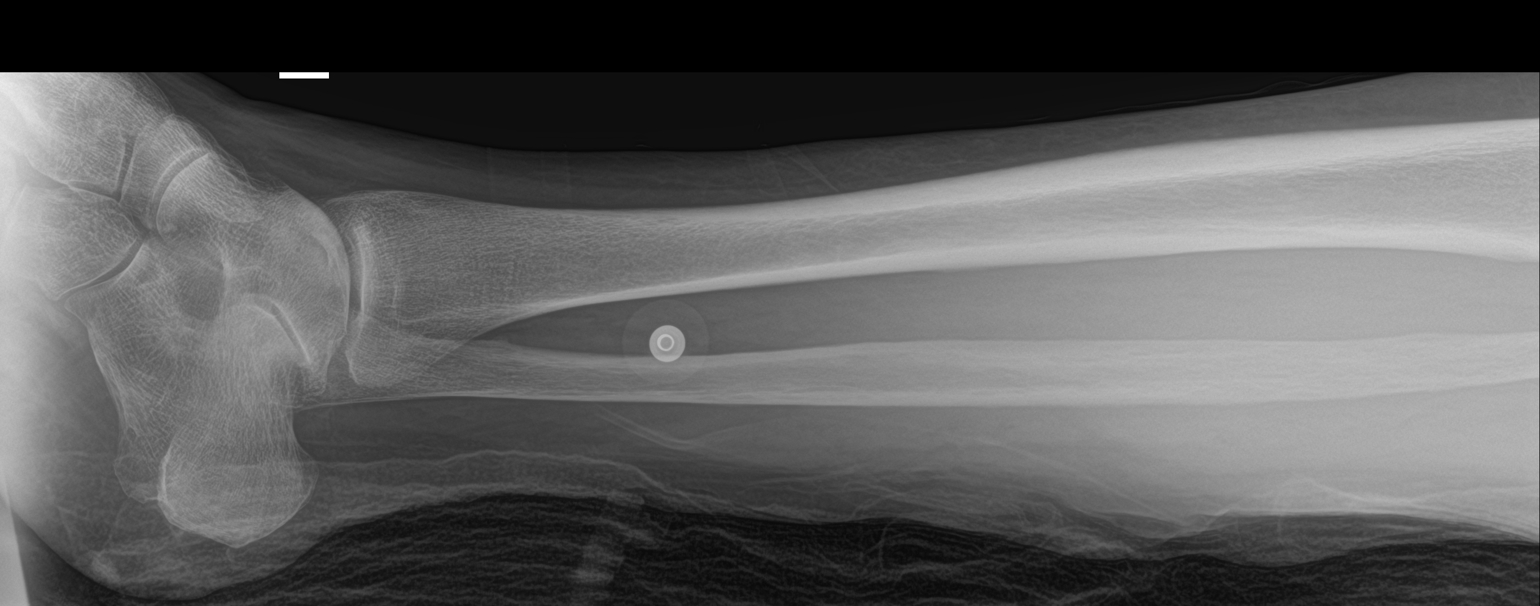

[4 of 4 positions shown; findings below may reference images not displayed]

FINDINGS: There is no evidence of fracture or other focal bone lesions. Soft
tissues are unremarkable.
IMPRESSION: Negative.

## 2019-05-16 IMAGING — DX DG PORTABLE PELVIS
1 series · 1 of 1 positions shown · non-contrast
Comparison: Intraoperative exam same date.

CLINICAL DATA: 72-year-old female. Post left hip replacement.
Subsequent encounter.

EXAM:
PORTABLE PELVIS 1-2 VIEWS

[pelvis]
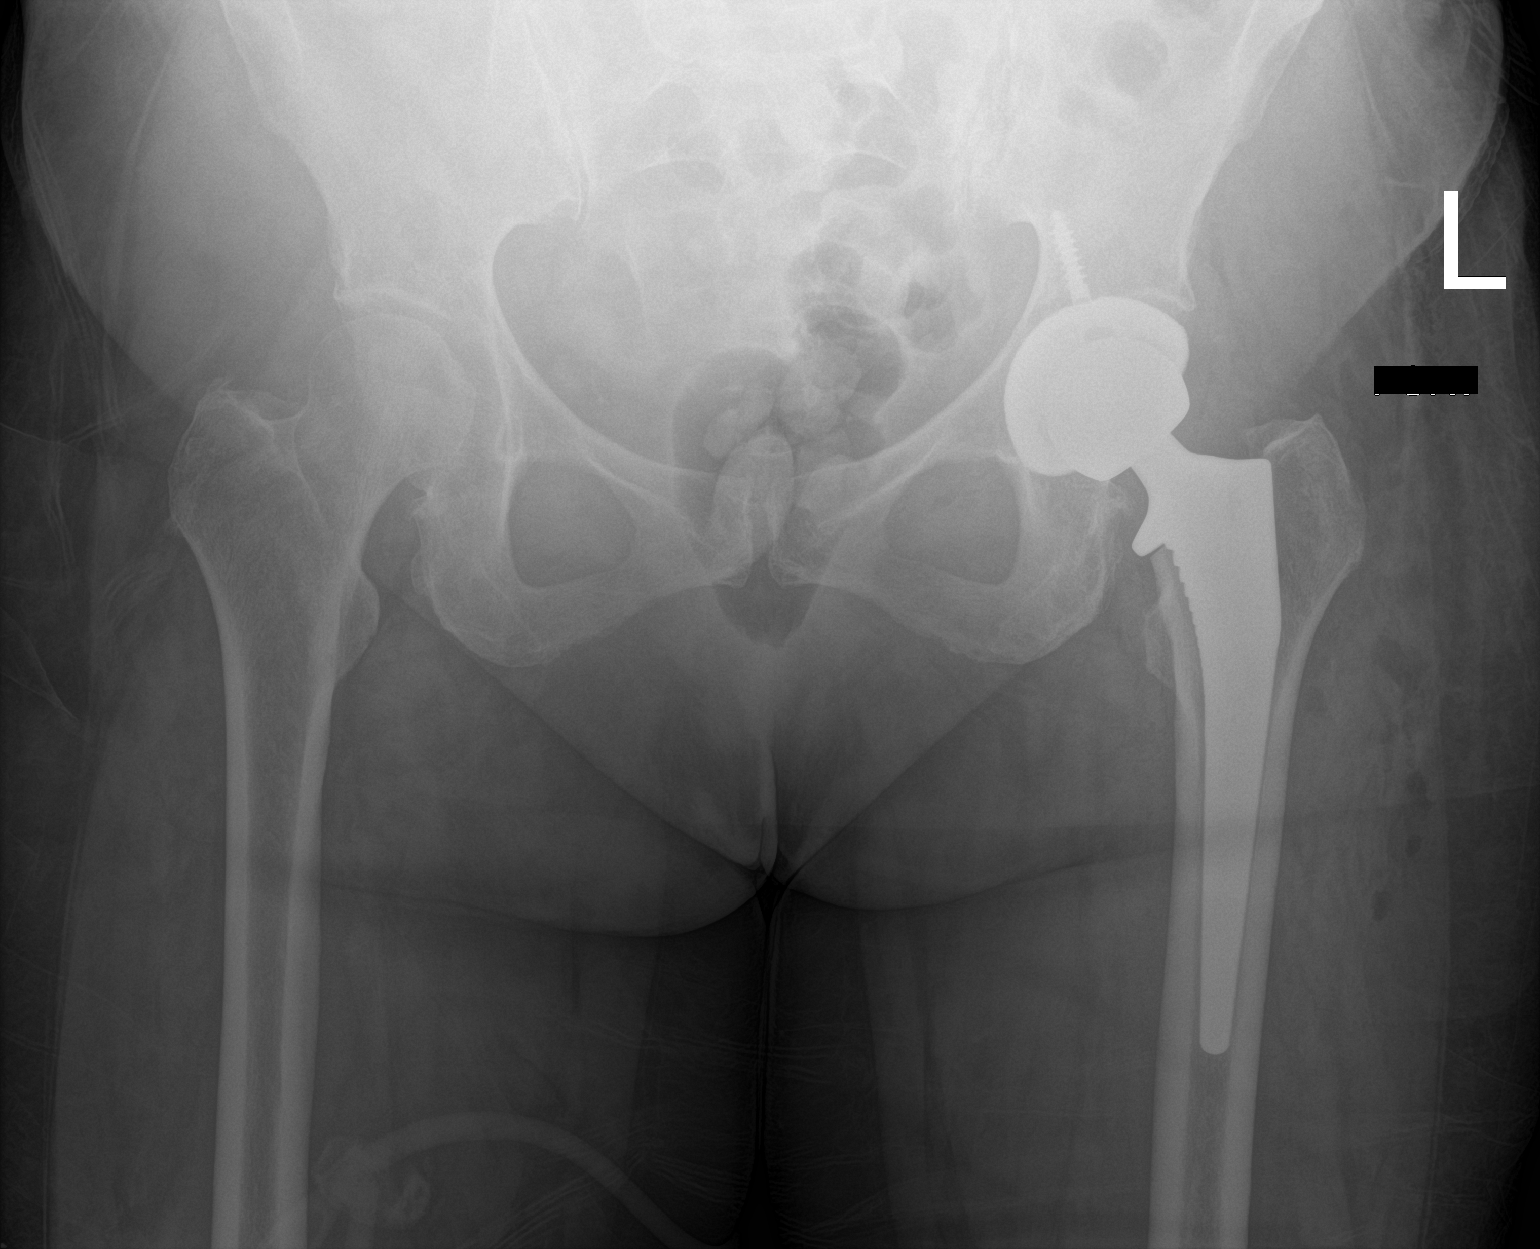

[1 of 1 positions shown; findings below may reference images not displayed]

FINDINGS: Post total left hip replacement which appears in satisfactory
position without complication noted on frontal projection.
IMPRESSION: Post left hip replacement.

## 2019-11-08 ENCOUNTER — Ambulatory Visit (INDEPENDENT_AMBULATORY_CARE_PROVIDER_SITE_OTHER): Payer: Medicare HMO

## 2019-11-08 ENCOUNTER — Ambulatory Visit: Payer: Medicare HMO | Admitting: Orthopaedic Surgery

## 2019-11-08 ENCOUNTER — Other Ambulatory Visit: Payer: Self-pay | Admitting: Physician Assistant

## 2019-11-08 ENCOUNTER — Encounter: Payer: Self-pay | Admitting: Orthopaedic Surgery

## 2019-11-08 DIAGNOSIS — G8929 Other chronic pain: Secondary | ICD-10-CM | POA: Diagnosis not present

## 2019-11-08 DIAGNOSIS — M25511 Pain in right shoulder: Secondary | ICD-10-CM | POA: Diagnosis not present

## 2019-11-08 DIAGNOSIS — M542 Cervicalgia: Secondary | ICD-10-CM | POA: Diagnosis not present

## 2019-11-08 MED ORDER — PREDNISONE 5 MG (21) PO TBPK: ORAL_TABLET | ORAL | 0 refills | Status: AC

## 2019-11-08 MED ORDER — METHOCARBAMOL 500 MG PO TABS: 500.0000 mg | ORAL_TABLET | Freq: Two times a day (BID) | ORAL | 0 refills | Status: AC | PRN

## 2019-11-08 NOTE — Progress Notes (Signed)
Office Visit Note   Patient: Stephanie Coleman           Date of Birth: [birth year]           MRN: [id number] Visit Date: 11/08/2019              Requested by: Mackie Pai, PA-C [address],  [place] [address] PCP: Mackie Pai, PA-C   Assessment & Plan: Visit Diagnoses:  1. Neck pain   2. Chronic right shoulder pain     Plan: Impression is right-sided cervical spine radiculopathy.  We will start the patient on a steroid taper and muscle relaxer.  We will also start her in physical therapy.  She will follow up with Stephanie Coleman as needed.  Follow-Up Instructions: Return if symptoms worsen or fail to improve.   Orders:  Orders Placed This Encounter  Procedures  . XR Shoulder Right  . XR Cervical Spine 2 or 3 views  . Ambulatory referral to Physical Therapy   Meds ordered this encounter  Medications  . predniSONE (STERAPRED UNI-PAK 21 TAB) 5 MG (21) TBPK tablet    Sig: Take as directed    Dispense:  21 tablet    Refill:  0  . methocarbamol (ROBAXIN) 500 MG tablet    Sig: Take 1 tablet (500 mg total) by mouth 2 (two) times daily as needed.    Dispense:  20 tablet    Refill:  0      Procedures: No procedures performed   Clinical Data: No additional findings.   Subjective: Chief Complaint  Patient presents with  . Right Shoulder - Pain    HPI patient is a pleasant 74 year old female who comes in today with right shoulder pain.  She notes shoulder pain that did feel different several years ago and was seen [facility].  She had a cortisone injection which did significantly help.  Her pain returned about a month ago.  No new injury or change in activity.  She does note that this pain is different, however.  The pain is primarily to the right lateral neck and radiates into the parascapular region as well as down the entire arm and into her hand.  She has increased pain when doing fine motor skills such as painting.  She has been taking  aspirin and using icy hot without significant relief of symptoms.  She does note numbness, tingling and burning to her entire arm which occurs intermittently.  She has a history of severe migraines in the past which she states caused muscle spasms around her neck.  No previous ESI's or surgery to the cervical spine.  Review of Systems as detailed in HPI.  All others reviewed and are negative.   Objective: Vital Signs: There were no vitals taken for this visit.  Physical Exam well-developed well-nourished female no acute distress.  Alert and oriented x3.  Ortho Exam right shoulder exam shows full active range of motion all planes without pain.  She is neurovascular intact distally.  Full strength throughout.  Cervical spine exam shows no spinous tenderness.  She does have paraspinous tenderness on the right with tenderness along the parascapular region.  She has increased pain with rotating her neck to the right.  Specialty Comments:  No specialty comments available.  Imaging: XR Cervical Spine 2 or 3 views  Result Date: 11/08/2019 Straightening of the cervical spine.  Otherwise, no acute findings  XR Shoulder Right  Result Date: 11/08/2019  Moderate degenerative changes to the [facility] joint    PMFS History: Patient Active Problem List   Diagnosis Date Noted  . Status post total hip replacement, left 04/18/2018  . Closed displaced fracture of left femoral neck ([place]) 03/05/2018  . Diet-controlled diabetes mellitus ([place]) 03/05/2018  . Intra-abdominal abscess ([place]) 01/30/2012  . Intra-abdominal fluid collection 01/29/2012   Past Medical History:  Diagnosis Date  . DDD (degenerative disc disease), lumbar   . Depression   . Diabetes mellitus without complication (HCC)    diet controlled  . Hyperlipidemia    pt states borderline  . Obesity     History reviewed. No pertinent family history.  Past Surgical History:  Procedure Laterality Date  . BACK SURGERY    . CHOLECYSTECTOMY      . colorectal surgery    . TOTAL HIP ARTHROPLASTY Left 03/06/2018   Procedure: TOTAL HIP ARTHROPLASTY ANTERIOR APPROACH;  Surgeon: Leandrew Koyanagi, MD;  Location: [facility];  Service: Orthopedics;  Laterality: Left;   Social History   Occupational History  . Not on file  Tobacco Use  . Smoking status: Never Smoker  . Smokeless tobacco: Never Used  Substance and Sexual Activity  . Alcohol use: No  . Drug use: No  . Sexual activity: Never

## 2019-11-21 ENCOUNTER — Other Ambulatory Visit: Payer: Self-pay

## 2019-11-21 ENCOUNTER — Ambulatory Visit: Payer: Medicare HMO | Admitting: Physical Therapy

## 2019-11-21 ENCOUNTER — Encounter: Payer: Self-pay | Admitting: Physical Therapy

## 2019-11-21 DIAGNOSIS — M542 Cervicalgia: Secondary | ICD-10-CM | POA: Diagnosis not present

## 2019-11-21 DIAGNOSIS — G8929 Other chronic pain: Secondary | ICD-10-CM | POA: Diagnosis not present

## 2019-11-21 DIAGNOSIS — M6281 Muscle weakness (generalized): Secondary | ICD-10-CM

## 2019-11-21 DIAGNOSIS — M25511 Pain in right shoulder: Secondary | ICD-10-CM

## 2019-11-21 NOTE — Therapy (Signed)
[facility] Physical Therapy [address] [place], [place], [address] Phone: [phone]   Fax:  [phone]  Physical Therapy Evaluation  Patient Details  Name: Stephanie Coleman MRN: [id number] Date of Birth: 1945-10-17 Referring Provider (PT): Aundra Dubin, [place]   Encounter Date: 11/21/2019   PT End of Session - 11/21/19 1129    Visit Number 1    Number of Visits 16    Date for PT Re-Evaluation 01/16/20    Authorization Type [employer]    PT Start Time 9381    PT Stop Time 1105    PT Time Calculation (min) 50 min    Activity Tolerance Patient limited by pain;Patient tolerated treatment well    Behavior During Therapy [facility] for tasks assessed/performed           Past Medical History:  Diagnosis Date  . DDD (degenerative disc disease), lumbar   . Depression   . Diabetes mellitus without complication (HCC)    diet controlled  . Hyperlipidemia    pt states borderline  . Obesity     Past Surgical History:  Procedure Laterality Date  . BACK SURGERY    . CHOLECYSTECTOMY    . colorectal surgery    . TOTAL HIP ARTHROPLASTY Left 03/06/2018   Procedure: TOTAL HIP ARTHROPLASTY ANTERIOR APPROACH;  Surgeon: Leandrew Koyanagi, MD;  Location: [place];  Service: Orthopedics;  Laterality: Left;    There were no vitals filed for this visit.    Subjective Assessment - 11/21/19 1017    Subjective She relays chronic neck pain with radiculopathy and Rt shoulder pain for last year that was exacerbated after fall in 2020. She does note numbness, tingling and burning to her entire arm which occurs intermittently.  She has a history of severe migraines in the past which she states caused muscle spasms around her neck.  No previous ESI's or surgery to the cervical spine.    Pertinent History PMH: Lt THA, DM, back surgery    Limitations Lifting;House hold activities;Reading    Diagnostic tests Recent XR of neck show "Straightening of the cervical spine.  Otherwise, no acute  findings". Recent Rt shoulder XR show "Moderate degenerative changes to the [facility] joint"    Patient Stated Goals reduce pain    Currently in Pain? Yes    Pain Score 5     Pain Location Neck   and Rt arm   Pain Orientation Right    Pain Descriptors / Indicators Aching;Numbness;Tingling    Pain Type Chronic pain    Pain Radiating Towards down Rt arm into entire hand now    Pain Onset More than a month ago    Pain Frequency Constant    Aggravating Factors  vacuum, cooking, fine motor tasks like writting, painting eating. sleeping on her back    Pain Relieving Factors steriod taper, hot shower, massage, walking    Multiple Pain Sites No              [facility] PT Assessment - 11/21/19 0001      Assessment   Medical Diagnosis neck pain with radiculopathy    Referring Provider (PT) Aundra Dubin, PA-C    Onset Date/Surgical Date --   Feb 2020 worsening episode of pain   Hand Dominance Right    Next MD Visit 02/28/20      Precautions   Precautions None      Restrictions   Weight Bearing Restrictions No      Balance Screen  Has the patient fallen in the past 6 months No    Has the patient had a decrease in activity level because of a fear of falling?  No    Is the patient reluctant to leave their home because of a fear of falling?  No      Home Ecologist residence      Prior Function   Level of Independence Independent    Vocation Retired    Psychologist, forensic, Press photographer, [employer], walking      Cognition   Overall Cognitive Status Within Functional Limits for tasks assessed      Observation/Other Assessments   Focus on Therapeutic Outcomes (FOTO)  63.8% funcitonal intake, goal is 70%      ROM / Strength   AROM / PROM / Strength AROM;Strength;PROM      AROM   Overall AROM Comments Rt shoulder ROM WFL    AROM Assessment Site Cervical;Shoulder    Cervical Flexion WNl    Cervical Extension WNL    Cervical - Right Side Bend 50%    Cervical  - Left Side Bend 75%    Cervical - Right Rotation 75%    Cervical - Left Rotation WNL      Strength   Overall Strength Comments Grip strength Lt 64.7, Rt 54    Strength Assessment Site Shoulder;Elbow    Right/Left Shoulder Right;Left    Right Shoulder Flexion 4+/5    Right Shoulder ABduction 4/5    Right Shoulder Internal Rotation 5/5    Right Shoulder External Rotation 4/5    Left Shoulder Flexion 4+/5    Left Shoulder Extension 4+/5    Left Shoulder Internal Rotation 5/5    Left Shoulder External Rotation 4+/5    Right/Left Elbow Right;Left    Right Elbow Flexion 5/5    Right Elbow Extension 5/5    Left Elbow Flexion 5/5    Left Elbow Extension 5/5      Special Tests   Other special tests +impingement tests Rt shoulder, negative spulings test, peripheralization with cervical distraction. peripheralization with central PA mobs to C6-T2                      Objective measurements completed on examination: See above findings.       [facility] Adult PT Treatment/Exercise - 11/21/19 0001      Modalities   Modalities Electrical Stimulation;Moist Heat      Moist Heat Therapy   Number Minutes Moist Heat 10 Minutes    Moist Heat Location Cervical      Electrical Stimulation   Electrical Stimulation Location neck and Rt shoulder    Electrical Stimulation Action IFC    Electrical Stimulation Parameters tolerance    Electrical Stimulation Goals Pain                  PT Education - 11/21/19 1128    Education Details HEP, POC, signs of centralization vs peripheralization    Person(s) Educated Patient    Methods Explanation;Demonstration;Handout    Comprehension Verbalized understanding;Returned demonstration;Need further instruction            PT Short Term Goals - 11/21/19 1141      PT SHORT TERM GOAL #1   Title Pt will be I and compliant with HEP.    Time 4    Period Weeks    Status New    Target Date 12/19/19      PT  SHORT TERM GOAL #2    Title Pt will improve radiculopathy from constant to intermittent    Time 4    Period Weeks    Status New             PT Long Term Goals - 11/21/19 1146      PT LONG TERM GOAL #1   Title Pt will improve FOTO score to at least 70% function.    Time 8    Period Weeks    Status New    Target Date 01/16/20      PT LONG TERM GOAL #2   Title Pt will improve Rt shoulder strength to 5/5, and Rt grip strength to 63 lbs    Time 8    Period Weeks    Status New      PT LONG TERM GOAL #3   Title Pt will improve cervical ROM to [facility].    Status New      PT LONG TERM GOAL #4   Title Pt will report no more than 3/10 pain and radiculopathy with normal activity    Status New                  Plan - 11/21/19 1130    Clinical Impression Statement Pt presents with chronic neck and Rt shoulder pain with cervical radiculopathy, she also has some signs of Rt shoulder impingment. She will benefit from skilled PT to address her functional deficits in neck ROM, Rt UE strength, and to reduce pain and radiculopathy.    Personal Factors and Comorbidities Comorbidity 2    Comorbidities PMH: Lt THA, DM, back surgery    Examination-Activity Limitations Dressing;Sleep;Reach Overhead    Examination-Participation Restrictions Cleaning;Driving;Laundry    Stability/Clinical Decision Making Evolving/Moderate complexity    Clinical Decision Making Moderate    Rehab Potential Good    PT Frequency 2x / week   1-2   PT Duration 8 weeks    PT Treatment/Interventions ADLs/Self Care Home Management;Cryotherapy;Electrical Stimulation;Iontophoresis 4mg /ml Dexamethasone;Moist Heat;Traction;Ultrasound;Neuromuscular re-education;Therapeutic exercise;Therapeutic activities;Manual techniques;Passive range of motion;Dry needling;Taping;Vasopneumatic Device;Spinal Manipulations;Joint Manipulations    PT Next Visit Plan review and update HEP PRN, monitor for centralization vs peripheralization, needs RTC strength,  neck/thoracic mobility, consider manual or modalaties or DN    PT Home Exercise Plan Access Code: [email or web address]    Consulted and Agree with Plan of Care Patient           Patient will benefit from skilled therapeutic intervention in order to improve the following deficits and impairments:  Decreased activity tolerance, Decreased endurance, Decreased range of motion, Decreased strength, Hypomobility, Impaired flexibility, Postural dysfunction, Pain, Increased fascial restricitons  Visit Diagnosis: Cervicalgia  Chronic right shoulder pain  Muscle weakness (generalized)     Problem List Patient Active Problem List   Diagnosis Date Noted  . Status post total hip replacement, left 04/18/2018  . Closed displaced fracture of left femoral neck ([place]) 03/05/2018  . Diet-controlled diabetes mellitus ([place]) 03/05/2018  . Intra-abdominal abscess ([place]) 01/30/2012  . Intra-abdominal fluid collection 01/29/2012    Silvestre Mesi 11/21/2019, 11:53 AM  [facility] Physical Therapy [address] [place], [place], [address] Phone: [phone]   Fax:  [phone]  Name: CHRISLYNN MOSELY MRN: [id number] Date of Birth: 01/01/1946

## 2019-11-21 NOTE — Patient Instructions (Signed)
Access Code: [email or web address] URL: [email or web address] Date: 11/21/2019 Prepared by: Elsie Ra  Exercises Cervical Retraction with Overpressure - 2 x daily - 6 x weekly - 2 sets - 10 reps - 1 hold Seated Thoracic Lumbar Extension - 2 x daily - 6 x weekly - 2 sets - 10 reps - 5 hold Shoulder External Rotation and Scapular Retraction with Resistance - 2 x daily - 6 x weekly - 3 sets - 10 reps Standing Row with Anchored Resistance - 2 x daily - 6 x weekly - 2-3 sets - 10-20 reps Seated Gripping Towel - 2 x daily - 6 x weekly - 2 sets - 20 reps

## 2019-11-23 ENCOUNTER — Encounter: Payer: Self-pay | Admitting: Rehabilitative and Restorative Service Providers"

## 2019-11-23 ENCOUNTER — Other Ambulatory Visit: Payer: Self-pay

## 2019-11-23 ENCOUNTER — Ambulatory Visit: Payer: Medicare HMO | Admitting: Rehabilitative and Restorative Service Providers"

## 2019-11-23 DIAGNOSIS — M5412 Radiculopathy, cervical region: Secondary | ICD-10-CM | POA: Diagnosis not present

## 2019-11-23 DIAGNOSIS — M6281 Muscle weakness (generalized): Secondary | ICD-10-CM | POA: Diagnosis not present

## 2019-11-23 DIAGNOSIS — R293 Abnormal posture: Secondary | ICD-10-CM | POA: Diagnosis not present

## 2019-11-23 NOTE — Therapy (Signed)
[facility] Physical Therapy [address] [place], [place], [address] Phone: [phone]   Fax:  [phone]  Physical Therapy Treatment  Patient Details  Name: Stephanie Coleman MRN: [id number] Date of Birth: [birth year] Referring Provider (PT): Aundra Dubin, [place]   Encounter Date: 11/23/2019   PT End of Session - 11/23/19 1613    Visit Number 2    Number of Visits 16    Date for PT Re-Evaluation 01/16/20    Authorization Type [employer]    PT Start Time 5277    PT Stop Time 1558    PT Time Calculation (min) 41 min    Activity Tolerance Patient tolerated treatment well    Behavior During Therapy [facility] for tasks assessed/performed           Past Medical History:  Diagnosis Date  . DDD (degenerative disc disease), lumbar   . Depression   . Diabetes mellitus without complication (HCC)    diet controlled  . Hyperlipidemia    pt states borderline  . Obesity     Past Surgical History:  Procedure Laterality Date  . BACK SURGERY    . CHOLECYSTECTOMY    . colorectal surgery    . TOTAL HIP ARTHROPLASTY Left 03/06/2018   Procedure: TOTAL HIP ARTHROPLASTY ANTERIOR APPROACH;  Surgeon: Leandrew Koyanagi, MD;  Location: [place];  Service: Orthopedics;  Laterality: Left;    There were no vitals filed for this visit.   Subjective Assessment - 11/23/19 1611    Subjective Aluna reports compliance with her early HEP.    Pertinent History PMH: Lt THA, DM, back surgery    Limitations Lifting;House hold activities;Reading    Diagnostic tests Recent XR of neck show "Straightening of the cervical spine.  Otherwise, no acute findings". Recent Rt shoulder XR show "Moderate degenerative changes to the [facility] joint"    Patient Stated Goals reduce pain    Currently in Pain? Yes    Pain Score 5     Pain Location Arm    Pain Orientation Right    Pain Descriptors / Indicators Aching;Numbness;Tingling    Pain Type Chronic pain    Pain Radiating Towards Neck to R hand    Pain Onset More  than a month ago    Pain Frequency Constant    Aggravating Factors  R UE use    Pain Relieving Factors Walking and heat    Effect of Pain on Daily Activities Limits R UE use with ADLs    Multiple Pain Sites No                             [facility] Adult PT Treatment/Exercise - 11/23/19 0001      Therapeutic Activites    Therapeutic Activities Other Therapeutic Activities   Spine anatomy, imaging results and basic posture 8 minutes     Exercises   Exercises Neck      Neck Exercises: Theraband   Scapula Retraction 20 reps;Green    Shoulder External Rotation 10 reps;Red;Other (comment)   Bilateral     Neck Exercises: Standing   Other Standing Exercises Shoulder blade pinches 10X 5 seconds    Other Standing Exercises --      Neck Exercises: Seated   Cervical Isometrics Extension;5 secs;10 reps      Neck Exercises: Supine   Other Supine Exercise Cervical traction 25# static for 10 minutes  PT Education - 11/23/19 1613    Education Details Reviewed and updated HEP.  Spent time on spine education.    Person(s) Educated Patient    Methods Explanation;Demonstration;Tactile cues;Verbal cues;Handout    Comprehension Verbal cues required;Need further instruction;Returned demonstration;Verbalized understanding            PT Short Term Goals - 11/23/19 1613      PT SHORT TERM GOAL #1   Title Pt will be I and compliant with HEP.    Time 4    Period Weeks    Status On-going    Target Date 12/19/19      PT SHORT TERM GOAL #2   Title Pt will improve radiculopathy from constant to intermittent    Time 4    Period Weeks    Status On-going             PT Long Term Goals - 11/21/19 1146      PT LONG TERM GOAL #1   Title Pt will improve FOTO score to at least 70% function.    Time 8    Period Weeks    Status New    Target Date 01/16/20      PT LONG TERM GOAL #2   Title Pt will improve Rt shoulder strength to 5/5, and Rt grip  strength to 63 lbs    Time 8    Period Weeks    Status New      PT LONG TERM GOAL #3   Title Pt will improve cervical ROM to [facility].    Status New      PT LONG TERM GOAL #4   Title Pt will report no more than 3/10 pain and radiculopathy with normal activity    Status New                 Plan - 11/23/19 1614    Clinical Impression Statement Stephanie Coleman has had a R UE radiculopathy for over a year.  We reviewed her HEP, added additional scapular and cervical strengthening and spent quite a bit of time reviewing spine anatomy and discussing posure.  Added cervical traction to address peripheral symptoms.    Personal Factors and Comorbidities Comorbidity 2    Comorbidities PMH: Lt THA, DM, back surgery    Examination-Activity Limitations Dressing;Sleep;Reach Overhead    Examination-Participation Restrictions Cleaning;Driving;Laundry    Stability/Clinical Decision Making Evolving/Moderate complexity    Rehab Potential Good    PT Frequency 2x / week   1-2   PT Duration 8 weeks    PT Treatment/Interventions ADLs/Self Care Home Management;Cryotherapy;Electrical Stimulation;Iontophoresis 4mg /ml Dexamethasone;Moist Heat;Traction;Ultrasound;Neuromuscular re-education;Therapeutic exercise;Therapeutic activities;Manual techniques;Passive range of motion;Dry needling;Taping;Vasopneumatic Device;Spinal Manipulations;Joint Manipulations    PT Next Visit Plan Continue traction for another 2 weeks.  Progress cervical, posterior RTC and scapular strengthening.    PT Home Exercise Plan Access Code: [email or web address]    Consulted and Agree with Plan of Care Patient           Patient will benefit from skilled therapeutic intervention in order to improve the following deficits and impairments:  Decreased activity tolerance, Decreased endurance, Decreased range of motion, Decreased strength, Hypomobility, Impaired flexibility, Postural dysfunction, Pain, Increased fascial restricitons  Visit Diagnosis: Abnormal  posture  Radiculopathy, cervical region  Muscle weakness (generalized)     Problem List Patient Active Problem List   Diagnosis Date Noted  . Status post total hip replacement, left 04/18/2018  . Closed displaced fracture of left femoral neck ([place]) 03/05/2018  . Diet-controlled  diabetes mellitus ([place]) 03/05/2018  . Intra-abdominal abscess ([place]) 01/30/2012  . Intra-abdominal fluid collection 01/29/2012    Farley Ly PT, [name] 11/23/2019, 4:18 PM  [facility] Physical Therapy [address] [place], [place], [address] Phone: [phone]   Fax:  [phone]  Name: Stephanie Coleman MRN: [id number] Date of Birth: [birth year]

## 2019-11-26 ENCOUNTER — Ambulatory Visit: Payer: Medicare HMO | Admitting: Physical Therapy

## 2019-11-26 ENCOUNTER — Encounter: Payer: Self-pay | Admitting: Physical Therapy

## 2019-11-26 ENCOUNTER — Other Ambulatory Visit: Payer: Self-pay

## 2019-11-26 DIAGNOSIS — M6281 Muscle weakness (generalized): Secondary | ICD-10-CM | POA: Diagnosis not present

## 2019-11-26 DIAGNOSIS — M542 Cervicalgia: Secondary | ICD-10-CM

## 2019-11-26 DIAGNOSIS — R293 Abnormal posture: Secondary | ICD-10-CM

## 2019-11-26 DIAGNOSIS — G8929 Other chronic pain: Secondary | ICD-10-CM

## 2019-11-26 DIAGNOSIS — M25511 Pain in right shoulder: Secondary | ICD-10-CM | POA: Diagnosis not present

## 2019-11-26 DIAGNOSIS — M5412 Radiculopathy, cervical region: Secondary | ICD-10-CM

## 2019-11-26 NOTE — Therapy (Signed)
[facility] Physical Therapy [address] [place], [place], [address] Phone: [phone]   Fax:  [phone]  Physical Therapy Treatment  Patient Details  Name: Stephanie Coleman MRN: [id number] Date of Birth: 1945-09-01 Referring Provider (PT): Aundra Dubin, [place]   Encounter Date: 11/26/2019   PT End of Session - 11/26/19 1526    Visit Number 3    Number of Visits 16    Date for PT Re-Evaluation 01/16/20    Authorization Type [employer]    PT Start Time 6415    PT Stop Time 1554    PT Time Calculation (min) 39 min    Activity Tolerance Patient tolerated treatment well    Behavior During Therapy [facility] for tasks assessed/performed           Past Medical History:  Diagnosis Date  . DDD (degenerative disc disease), lumbar   . Depression   . Diabetes mellitus without complication (HCC)    diet controlled  . Hyperlipidemia    pt states borderline  . Obesity     Past Surgical History:  Procedure Laterality Date  . BACK SURGERY    . CHOLECYSTECTOMY    . colorectal surgery    . TOTAL HIP ARTHROPLASTY Left 03/06/2018   Procedure: TOTAL HIP ARTHROPLASTY ANTERIOR APPROACH;  Surgeon: Leandrew Koyanagi, MD;  Location: [place];  Service: Orthopedics;  Laterality: Left;    There were no vitals filed for this visit.   Subjective Assessment - 11/26/19 1522    Subjective Pt reporting over the weekend she was standing in her kitchen chopping vegetables and her R hand went numb.    Pertinent History PMH: Lt THA, DM, back surgery    Limitations Lifting;House hold activities;Reading    Diagnostic tests Recent XR of neck show "Straightening of the cervical spine.  Otherwise, no acute findings". Recent Rt shoulder XR show "Moderate degenerative changes to the [facility] joint"    Patient Stated Goals reduce pain    Currently in Pain? No/denies    Pain Location Arm    Pain Orientation Right    Pain Descriptors / Indicators Aching;Numbness;Tingling    Pain Type Chronic pain    Pain  Onset More than a month ago                             [facility] Adult PT Treatment/Exercise - 11/26/19 0001      Exercises   Exercises Neck      Neck Exercises: Theraband   Scapula Retraction 15 reps;Green    Rows 20 reps;Red    Shoulder External Rotation 10 reps;Red;Other (comment)   Bilateral     Neck Exercises: Standing   Other Standing Exercises Shoulder blade pinches 10X 5 seconds    Other Standing Exercises Door stretch elbows and shoulders at 90 degrees x 5 holding 10 seconds each      Neck Exercises: Seated   Cervical Isometrics Extension;5 secs;10 reps   standing against wall with pillow behind head for support     Modalities   Modalities Traction      Traction   Type of Traction Cervical    Min (lbs) 7    Max (lbs) 12    Hold Time standard cervical protocol    Rest Time standard cerivcal protocol    Time 15 minutes                    PT Short Term Goals -  11/26/19 1541      PT SHORT TERM GOAL #1   Title Pt will be I and compliant with HEP.    Status On-going      PT SHORT TERM GOAL #2   Title Pt will improve radiculopathy from constant to intermittent    Status On-going             PT Long Term Goals - 11/21/19 1146      PT LONG TERM GOAL #1   Title Pt will improve FOTO score to at least 70% function.    Time 8    Period Weeks    Status New    Target Date 01/16/20      PT LONG TERM GOAL #2   Title Pt will improve Rt shoulder strength to 5/5, and Rt grip strength to 63 lbs    Time 8    Period Weeks    Status New      PT LONG TERM GOAL #3   Title Pt will improve cervical ROM to [facility].    Status New      PT LONG TERM GOAL #4   Title Pt will report no more than 3/10 pain and radiculopathy with normal activity    Status New                 Plan - 11/26/19 1529    Clinical Impression Statement Pt arriving today reporting numbness and tingling down R UE into hand intermittently. Pt tolerating  posture  execises well. Performed cervical traction x 15 minutes at 12# max pull. Pt tolerating well.  Continue skilled PT.    Personal Factors and Comorbidities Comorbidity 2    Comorbidities PMH: Lt THA, DM, back surgery    Examination-Activity Limitations Dressing;Sleep;Reach Overhead    Examination-Participation Restrictions Cleaning;Driving;Laundry    Stability/Clinical Decision Making Evolving/Moderate complexity    Rehab Potential Good    PT Frequency 2x / week    PT Duration 8 weeks    PT Treatment/Interventions ADLs/Self Care Home Management;Cryotherapy;Electrical Stimulation;Iontophoresis 4mg /ml Dexamethasone;Moist Heat;Traction;Ultrasound;Neuromuscular re-education;Therapeutic exercise;Therapeutic activities;Manual techniques;Passive range of motion;Dry needling;Taping;Vasopneumatic Device;Spinal Manipulations;Joint Manipulations    PT Next Visit Plan Continue traction for another 2 weeks.  Progress cervical, posterior RTC and scapular strengthening.    PT Home Exercise Plan Access Code: [email or web address]    Consulted and Agree with Plan of Care Patient           Patient will benefit from skilled therapeutic intervention in order to improve the following deficits and impairments:  Decreased activity tolerance, Decreased endurance, Decreased range of motion, Decreased strength, Hypomobility, Impaired flexibility, Postural dysfunction, Pain, Increased fascial restricitons  Visit Diagnosis: Abnormal posture  Radiculopathy, cervical region  Muscle weakness (generalized)  Cervicalgia  Chronic right shoulder pain     Problem List Patient Active Problem List   Diagnosis Date Noted  . Status post total hip replacement, left 04/18/2018  . Closed displaced fracture of left femoral neck ([place]) 03/05/2018  . Diet-controlled diabetes mellitus ([place]) 03/05/2018  . Intra-abdominal abscess ([place]) 01/30/2012  . Intra-abdominal fluid collection 01/29/2012    [name], PT, [name] 11/26/2019,  3:47 PM  [facility] Physical Therapy [address] [place], [place], [address] Phone: [phone]   Fax:  [phone]  Name: Stephanie Coleman MRN: [id number] Date of Birth: 01/06/1946

## 2019-12-11 ENCOUNTER — Encounter: Payer: Self-pay | Admitting: Physical Therapy

## 2019-12-11 ENCOUNTER — Other Ambulatory Visit: Payer: Self-pay

## 2019-12-11 ENCOUNTER — Ambulatory Visit: Payer: Medicare HMO | Admitting: Physical Therapy

## 2019-12-11 DIAGNOSIS — R293 Abnormal posture: Secondary | ICD-10-CM

## 2019-12-11 DIAGNOSIS — M5412 Radiculopathy, cervical region: Secondary | ICD-10-CM

## 2019-12-11 DIAGNOSIS — M6281 Muscle weakness (generalized): Secondary | ICD-10-CM

## 2019-12-11 DIAGNOSIS — M542 Cervicalgia: Secondary | ICD-10-CM | POA: Diagnosis not present

## 2019-12-11 DIAGNOSIS — M25511 Pain in right shoulder: Secondary | ICD-10-CM | POA: Diagnosis not present

## 2019-12-11 DIAGNOSIS — G8929 Other chronic pain: Secondary | ICD-10-CM | POA: Diagnosis not present

## 2019-12-11 NOTE — Therapy (Signed)
[facility] Physical Therapy [address] [place], [place], [address] Phone: [phone]   Fax:  [phone]  Physical Therapy Treatment  Patient Details  Name: Stephanie Coleman MRN: [id number] Date of Birth: Jan 20, 1945 Referring Provider (PT): Aundra Dubin, [place]   Encounter Date: 12/11/2019   PT End of Session - 12/11/19 1334    Visit Number 4    Number of Visits 16    Date for PT Re-Evaluation 01/16/20    Authorization Type [employer]    PT Start Time 1300    PT Stop Time 1347    PT Time Calculation (min) 47 min    Activity Tolerance Patient tolerated treatment well    Behavior During Therapy [facility] for tasks assessed/performed           Past Medical History:  Diagnosis Date  . DDD (degenerative disc disease), lumbar   . Depression   . Diabetes mellitus without complication (HCC)    diet controlled  . Hyperlipidemia    pt states borderline  . Obesity     Past Surgical History:  Procedure Laterality Date  . BACK SURGERY    . CHOLECYSTECTOMY    . colorectal surgery    . TOTAL HIP ARTHROPLASTY Left 03/06/2018   Procedure: TOTAL HIP ARTHROPLASTY ANTERIOR APPROACH;  Surgeon: Leandrew Koyanagi, MD;  Location: [place];  Service: Orthopedics;  Laterality: Left;    There were no vitals filed for this visit.   Subjective Assessment - 12/11/19 1307    Subjective Pt  arriving to therapy reporting 3-4/10 pain in her lower neck. Pt wanting to try traction again.    Pertinent History PMH: Lt THA, DM, back surgery    Diagnostic tests Recent XR of neck show "Straightening of the cervical spine.  Otherwise, no acute findings". Recent Rt shoulder XR show "Moderate degenerative changes to the [facility] joint"    Currently in Pain? Yes    Pain Score 4     Pain Location Arm    Pain Orientation Right    Pain Descriptors / Indicators Aching    Pain Type Chronic pain    Pain Onset More than a month ago                             [facility] Adult PT  Treatment/Exercise - 12/11/19 0001      Exercises   Exercises Neck      Neck Exercises: Theraband   Scapula Retraction 15 reps;Green    Rows 20 reps;Red    Shoulder External Rotation 10 reps;Red;Other (comment)   Bilateral     Neck Exercises: Standing   Other Standing Exercises wall angles x 5     Other Standing Exercises Door stretch elbows and shoulders at 90 degrees x 5 holding 10 seconds each      Neck Exercises: Seated   Cervical Isometrics Extension;5 secs;10 reps   standing against wall with pillow behind head for support     Traction   Type of Traction Cervical    Min (lbs) 7    Max (lbs) 15    Hold Time standard cervical protocol    Rest Time standard cerivcal protocol    Time 15 minutes                    PT Short Term Goals - 11/26/19 1541      PT SHORT TERM GOAL #1   Title Pt will be I  and compliant with HEP.    Status On-going      PT SHORT TERM GOAL #2   Title Pt will improve radiculopathy from constant to intermittent    Status On-going             PT Long Term Goals - 12/11/19 1336      PT LONG TERM GOAL #1   Title Pt will improve FOTO score to at least 70% function.    Status On-going      PT LONG TERM GOAL #2   Title Pt will improve Rt shoulder strength to 5/5, and Rt grip strength to 63 lbs    Status On-going      PT LONG TERM GOAL #3   Title Pt will improve cervical ROM to [facility].    Status On-going      PT LONG TERM GOAL #4   Title Pt will report no more than 3/10 pain and radiculopathy with normal activity    Status On-going                 Plan - 12/11/19 1335    Clinical Impression Statement Pt still reporting numbness and tingling down R UE. Pt tolreating exericses with no reports of radiation or increased pain. Mechanical traction performed x 15 minutes with #15 pound max pull. Continue skilled PT.    Personal Factors and Comorbidities Comorbidity 2    Comorbidities PMH: Lt THA, DM, back surgery     Examination-Activity Limitations Dressing;Sleep;Reach Overhead    Examination-Participation Restrictions Cleaning;Driving;Laundry    Stability/Clinical Decision Making Evolving/Moderate complexity    Rehab Potential Good    PT Frequency 2x / week    PT Duration 8 weeks    PT Treatment/Interventions ADLs/Self Care Home Management;Cryotherapy;Electrical Stimulation;Iontophoresis 4mg /ml Dexamethasone;Moist Heat;Traction;Ultrasound;Neuromuscular re-education;Therapeutic exercise;Therapeutic activities;Manual techniques;Passive range of motion;Dry needling;Taping;Vasopneumatic Device;Spinal Manipulations;Joint Manipulations    PT Next Visit Plan Continue traction for another 2 weeks.  Progress cervical, posterior RTC and scapular strengthening.    PT Home Exercise Plan Access Code: [email or web address]    Consulted and Agree with Plan of Care Patient           Patient will benefit from skilled therapeutic intervention in order to improve the following deficits and impairments:  Decreased activity tolerance, Decreased endurance, Decreased range of motion, Decreased strength, Hypomobility, Impaired flexibility, Postural dysfunction, Pain, Increased fascial restricitons  Visit Diagnosis: Abnormal posture  Radiculopathy, cervical region  Muscle weakness (generalized)  Cervicalgia  Chronic right shoulder pain     Problem List Patient Active Problem List   Diagnosis Date Noted  . Status post total hip replacement, left 04/18/2018  . Closed displaced fracture of left femoral neck ([place]) 03/05/2018  . Diet-controlled diabetes mellitus ([place]) 03/05/2018  . Intra-abdominal abscess ([place]) 01/30/2012  . Intra-abdominal fluid collection 01/29/2012    [name], PT, [name] 12/11/2019, 1:37 PM  [facility] Physical Therapy [address] [place], [place], [address] Phone: [phone]   Fax:  [phone]  Name: Stephanie Coleman MRN: [id number] Date of Birth: 07/24/1945

## 2019-12-13 ENCOUNTER — Other Ambulatory Visit: Payer: Self-pay

## 2019-12-13 ENCOUNTER — Ambulatory Visit: Payer: Medicare HMO | Admitting: Rehabilitative and Restorative Service Providers"

## 2019-12-13 ENCOUNTER — Encounter: Payer: Self-pay | Admitting: Rehabilitative and Restorative Service Providers"

## 2019-12-13 DIAGNOSIS — M542 Cervicalgia: Secondary | ICD-10-CM

## 2019-12-13 DIAGNOSIS — M5412 Radiculopathy, cervical region: Secondary | ICD-10-CM

## 2019-12-13 DIAGNOSIS — R293 Abnormal posture: Secondary | ICD-10-CM | POA: Diagnosis not present

## 2019-12-13 DIAGNOSIS — M6281 Muscle weakness (generalized): Secondary | ICD-10-CM

## 2019-12-13 NOTE — Therapy (Signed)
[facility] Physical Therapy [address] [place], [place], [address] Phone: [phone]   Fax:  [phone]  Physical Therapy Treatment  Patient Details  Name: Stephanie Coleman MRN: [id number] Date of Birth: [birth year] Referring Provider (PT): Aundra Dubin, [place]   Encounter Date: 12/13/2019   PT End of Session - 12/13/19 1529    Visit Number 5    Number of Visits 16    Date for PT Re-Evaluation 01/16/20    Authorization Type [employer]    PT Start Time 1300    PT Stop Time 1344    PT Time Calculation (min) 44 min    Activity Tolerance Patient tolerated treatment well    Behavior During Therapy [facility] for tasks assessed/performed           Past Medical History:  Diagnosis Date  . DDD (degenerative disc disease), lumbar   . Depression   . Diabetes mellitus without complication (HCC)    diet controlled  . Hyperlipidemia    pt states borderline  . Obesity     Past Surgical History:  Procedure Laterality Date  . BACK SURGERY    . CHOLECYSTECTOMY    . colorectal surgery    . TOTAL HIP ARTHROPLASTY Left 03/06/2018   Procedure: TOTAL HIP ARTHROPLASTY ANTERIOR APPROACH;  Surgeon: Leandrew Koyanagi, MD;  Location: [place];  Service: Orthopedics;  Laterality: Left;    There were no vitals filed for this visit.   Subjective Assessment - 12/13/19 1340    Subjective Stephanie Coleman notes a decrease in frequency and intensity of her scapular and arm symptoms since starting PT.  Pushing a shopping cart and vacuuming are still irritating.    Pertinent History PMH: Lt THA, DM, back surgery    Diagnostic tests Recent XR of neck show "Straightening of the cervical spine.  Otherwise, no acute findings". Recent Rt shoulder XR show "Moderate degenerative changes to the [facility] joint"    Currently in Pain? Yes    Pain Score 4     Pain Location Arm    Pain Orientation Right    Pain Descriptors / Indicators Aching    Pain Type Chronic pain    Pain Radiating Towards To R hand    Pain Onset More  than a month ago    Pain Frequency Constant    Aggravating Factors  Vacuuming and pushing a shopping cart    Pain Relieving Factors Walking and change of position    Effect of Pain on Daily Activities Vacuuming and pushing a cart are limited.  Can still wake up due to scapular and arm pain.    Multiple Pain Sites No                             [facility] Adult PT Treatment/Exercise - 12/13/19 0001      Therapeutic Activites    Therapeutic Activities Other Therapeutic Activities   Return to work suggestions with posture, mechanics     Exercises   Exercises Neck      Neck Exercises: Machines for Strengthening   UBE (Upper Arm Bike) Pull only (scapular emphasis) 5 minutes (:15 pull/:15 rest)      Neck Exercises: Theraband   Scapula Retraction 20 reps;Blue    Shoulder Extension 10 reps;Red    Shoulder External Rotation 10 reps;Red;Other (comment)   Bilateral     Neck Exercises: Standing   Other Standing Exercises Shoulder blade pinches 10X 5 seconds  Other Standing Exercises --      Neck Exercises: Seated   Cervical Isometrics Extension;5 secs;10 reps      Neck Exercises: Supine   Other Supine Exercise Cervical traction 25# static for 10 minutes      Traction   Type of Traction Cervical    Min (lbs) 0    Max (lbs) 26    Hold Time 10 minutes static    Rest Time 0    Time 12 minutes                  PT Education - 12/13/19 1527    Education Details Reviewed HEP, body mechanics and postural education (lumbar roll, short frequent breaks to posturally correct, etc).    Person(s) Educated Patient    Methods Explanation;Demonstration;Verbal cues    Comprehension Verbalized understanding;Need further instruction;Returned demonstration;Verbal cues required            PT Short Term Goals - 12/13/19 1528      PT SHORT TERM GOAL #1   Title Pt will be I and compliant with HEP.    Status Achieved      PT SHORT TERM GOAL #2   Title Pt will improve  radiculopathy from constant to intermittent    Status On-going             PT Long Term Goals - 12/13/19 1529      PT LONG TERM GOAL #1   Title Pt will improve FOTO score to at least 70% function.    Status On-going      PT LONG TERM GOAL #2   Title Pt will improve Rt shoulder strength to 5/5, and Rt grip strength to 63 lbs    Status On-going      PT LONG TERM GOAL #3   Title Pt will improve cervical ROM to [facility].    Status On-going      PT LONG TERM GOAL #4   Title Pt will report no more than 3/10 pain and radiculopathy with normal activity    Status On-going                 Plan - 12/13/19 1529    Clinical Impression Statement Stephanie Coleman notes significant progress in the intensity of her scapular and arm pain.  Symptoms are still relatively constant.  Continue scapular and cervical strengthening, postural and body mechanics work to reduce and possibly remove peripheral symptoms.    Personal Factors and Comorbidities Comorbidity 2    Comorbidities PMH: Lt THA, DM, back surgery    Examination-Activity Limitations Dressing;Sleep;Reach Overhead    Examination-Participation Restrictions Cleaning;Driving;Laundry    Stability/Clinical Decision Making Evolving/Moderate complexity    Rehab Potential Good    PT Frequency 2x / week    PT Duration 8 weeks    PT Treatment/Interventions ADLs/Self Care Home Management;Cryotherapy;Electrical Stimulation;Iontophoresis 4mg /ml Dexamethasone;Moist Heat;Traction;Ultrasound;Neuromuscular re-education;Therapeutic exercise;Therapeutic activities;Manual techniques;Passive range of motion;Dry needling;Taping;Vasopneumatic Device;Spinal Manipulations;Joint Manipulations    PT Next Visit Plan Continue traction for another 2 weeks.  Progress cervical, posterior RTC and scapular strengthening.    PT Home Exercise Plan Access Code: [email or web address]    Consulted and Agree with Plan of Care Patient           Patient will benefit from skilled therapeutic  intervention in order to improve the following deficits and impairments:  Decreased activity tolerance, Decreased endurance, Decreased range of motion, Decreased strength, Hypomobility, Impaired flexibility, Postural dysfunction, Pain, Increased fascial restricitons  Visit Diagnosis: Abnormal posture  Radiculopathy, cervical region  Muscle weakness (generalized)  Cervicalgia     Problem List Patient Active Problem List   Diagnosis Date Noted  . Status post total hip replacement, left 04/18/2018  . Closed displaced fracture of left femoral neck ([place]) 03/05/2018  . Diet-controlled diabetes mellitus ([place]) 03/05/2018  . Intra-abdominal abscess ([place]) 01/30/2012  . Intra-abdominal fluid collection 01/29/2012    Farley Ly PT, [name] 12/13/2019, 3:34 PM  [facility] Physical Therapy [address] [place], [place], [address] Phone: [phone]   Fax:  [phone]  Name: Stephanie Coleman MRN: [id number] Date of Birth: 04-12-1945

## 2019-12-18 ENCOUNTER — Other Ambulatory Visit: Payer: Self-pay

## 2019-12-18 ENCOUNTER — Ambulatory Visit: Payer: Medicare HMO | Admitting: Physical Therapy

## 2019-12-18 DIAGNOSIS — G8929 Other chronic pain: Secondary | ICD-10-CM

## 2019-12-18 DIAGNOSIS — M542 Cervicalgia: Secondary | ICD-10-CM

## 2019-12-18 DIAGNOSIS — M6281 Muscle weakness (generalized): Secondary | ICD-10-CM | POA: Diagnosis not present

## 2019-12-18 DIAGNOSIS — M5412 Radiculopathy, cervical region: Secondary | ICD-10-CM

## 2019-12-18 DIAGNOSIS — M25511 Pain in right shoulder: Secondary | ICD-10-CM | POA: Diagnosis not present

## 2019-12-18 DIAGNOSIS — R293 Abnormal posture: Secondary | ICD-10-CM | POA: Diagnosis not present

## 2019-12-18 NOTE — Therapy (Addendum)
[facility] Physical Therapy [address] [place], [place], [address] Phone: [phone]   Fax:  [phone]  Physical Therapy Treatment/Discharge PHYSICAL THERAPY DISCHARGE SUMMARY  Visits from Start of Care: 6  Current functional level related to goals / functional outcomes: See below   Remaining deficits:see below   Education / Equipment: HEP Plan: Patient agrees to discharge.  Patient goals were partially met. Patient is being discharged due to not returning since the last visit.  ?????   Elsie Ra, PT, DPT 02/20/20 2:59 PM     Patient Details  Name: Stephanie Coleman MRN: [id number] Date of Birth: [birth year] Referring Provider (PT): Aundra Dubin, [place]   Encounter Date: 12/18/2019   PT End of Session - 12/18/19 1520    Visit Number 6    Number of Visits 16    Date for PT Re-Evaluation 01/16/20    Authorization Type [employer]    PT Start Time 3013    PT Stop Time 1550    PT Time Calculation (min) 35 min    Activity Tolerance Patient tolerated treatment well    Behavior During Therapy [facility] for tasks assessed/performed           Past Medical History:  Diagnosis Date  . DDD (degenerative disc disease), lumbar   . Depression   . Diabetes mellitus without complication (HCC)    diet controlled  . Hyperlipidemia    pt states borderline  . Obesity     Past Surgical History:  Procedure Laterality Date  . BACK SURGERY    . CHOLECYSTECTOMY    . colorectal surgery    . TOTAL HIP ARTHROPLASTY Left 03/06/2018   Procedure: TOTAL HIP ARTHROPLASTY ANTERIOR APPROACH;  Surgeon: Leandrew Koyanagi, MD;  Location: [place];  Service: Orthopedics;  Laterality: Left;    There were no vitals filed for this visit.   Subjective Assessment - 12/18/19 1518    Subjective Pt arriving to therpay reporting no pain, but pt did report after using the UBE last session her right shoulder was sore.    Pertinent History PMH: Lt THA, DM, back surgery    Limitations  Lifting;House hold activities;Reading    Diagnostic tests Recent XR of neck show "Straightening of the cervical spine.  Otherwise, no acute findings". Recent Rt shoulder XR show "Moderate degenerative changes to the [facility] joint"    Patient Stated Goals reduce pain    Currently in Pain? No/denies                             [facility] Adult PT Treatment/Exercise - 12/18/19 0001      Exercises   Exercises Neck      Neck Exercises: Theraband   Scapula Retraction 15 reps;Green    Shoulder Extension 15 reps;Red    Rows 20 reps;Green    Shoulder External Rotation 10 reps;Red;Other (comment)   Bilateral     Neck Exercises: Standing   Other Standing Exercises Door stretch x 3 holding 20 seconds each      Neck Exercises: Seated   Cervical Isometrics Extension;5 secs;10 reps      Traction   Type of Traction Cervical    Min (lbs) 8    Max (lbs) 25    Hold Time standard cervical protocol    Rest Time standard cervical protocol    Time 15 minutes  PT Short Term Goals - 12/13/19 1528      PT SHORT TERM GOAL #1   Title Pt will be I and compliant with HEP.    Status Achieved      PT SHORT TERM GOAL #2   Title Pt will improve radiculopathy from constant to intermittent    Status On-going             PT Long Term Goals - 12/18/19 1521      PT LONG TERM GOAL #1   Title Pt will improve FOTO score to at least 70% function.    Status On-going      PT LONG TERM GOAL #2   Title Pt will improve Rt shoulder strength to 5/5, and Rt grip strength to 63 lbs    Status On-going      PT LONG TERM GOAL #3   Title Pt will improve cervical ROM to [facility].    Status On-going      PT LONG TERM GOAL #4   Title Pt will report no more than 3/10 pain and radiculopathy with normal activity    Status On-going                 Plan - 12/18/19 1539    Clinical Impression Statement Pt reporting her R arm pain doesn't happen as often since beginning  therpay. Pt tolreating exercises well progressing with scapular strengtheing and postural exercises. Continuing with mechanical traction and skilled PT.    Personal Factors and Comorbidities Comorbidity 2    Comorbidities PMH: Lt THA, DM, back surgery    Examination-Activity Limitations Dressing;Sleep;Reach Overhead    Examination-Participation Restrictions Cleaning;Driving;Laundry    Stability/Clinical Decision Making Evolving/Moderate complexity    PT Treatment/Interventions ADLs/Self Care Home Management;Cryotherapy;Electrical Stimulation;Iontophoresis 92m/ml Dexamethasone;Moist Heat;Traction;Ultrasound;Neuromuscular re-education;Therapeutic exercise;Therapeutic activities;Manual techniques;Passive range of motion;Dry needling;Taping;Vasopneumatic Device;Spinal Manipulations;Joint Manipulations    PT Next Visit Plan Continue traction for another 2 weeks.  Progress cervical, posterior RTC and scapular strengthening.    PT Home Exercise Plan Access Code: [email or web address]    Consulted and Agree with Plan of Care Patient           Patient will benefit from skilled therapeutic intervention in order to improve the following deficits and impairments:  Decreased activity tolerance, Decreased endurance, Decreased range of motion, Decreased strength, Hypomobility, Impaired flexibility, Postural dysfunction, Pain, Increased fascial restricitons  Visit Diagnosis: Abnormal posture  Radiculopathy, cervical region  Muscle weakness (generalized)  Cervicalgia  Chronic right shoulder pain     Problem List Patient Active Problem List   Diagnosis Date Noted  . Status post total hip replacement, left 04/18/2018  . Closed displaced fracture of left femoral neck ([place] 03/05/2018  . Diet-controlled diabetes mellitus ([place] 03/05/2018  . Intra-abdominal abscess ([place] 01/30/2012  . Intra-abdominal fluid collection 01/29/2012    JOretha Caprice PT, [name] 12/18/2019, 3:42 PM  [facility] [address] [address]Phone: [phone]  Fax:  [phone] Name: Stephanie LEVENTHALMRN: [id number] of Birth: [birth year]

## 2019-12-20 ENCOUNTER — Encounter: Payer: Medicare HMO | Admitting: Rehabilitative and Restorative Service Providers"

## 2020-02-28 ENCOUNTER — Ambulatory Visit: Payer: Medicare HMO | Admitting: Orthopaedic Surgery

## 2020-02-29 ENCOUNTER — Other Ambulatory Visit: Payer: Self-pay

## 2020-02-29 ENCOUNTER — Ambulatory Visit (INDEPENDENT_AMBULATORY_CARE_PROVIDER_SITE_OTHER): Payer: Medicare HMO

## 2020-02-29 ENCOUNTER — Encounter: Payer: Self-pay | Admitting: Orthopaedic Surgery

## 2020-02-29 ENCOUNTER — Ambulatory Visit: Payer: Medicare HMO | Admitting: Orthopaedic Surgery

## 2020-02-29 DIAGNOSIS — Z96642 Presence of left artificial hip joint: Secondary | ICD-10-CM

## 2020-02-29 MED ORDER — CLOBETASOL PROPIONATE 0.05 % EX OINT: 1.0000 "application " | TOPICAL_OINTMENT | Freq: Two times a day (BID) | CUTANEOUS | 2 refills | Status: AC

## 2020-02-29 MED ORDER — AMOXICILLIN 500 MG PO CAPS: 2000.0000 mg | ORAL_CAPSULE | Freq: Once | ORAL | 6 refills | Status: AC

## 2020-02-29 NOTE — Progress Notes (Signed)
   Office Visit Note   Patient: Stephanie Coleman           Date of Birth: 03/14/1945           MRN: [id number] Visit Date: 02/29/2020              Requested by: Mackie Pai, PA-C [place],   [address] PCP: Mackie Pai, PA-C   Assessment & Plan: Visit Diagnoses:  1. Status post total hip replacement, left     Plan: Impression is 2-year status post left total hip replacement after femoral neck fracture.  She has recovered from this very well.  At this point we will release her.  Follow-up as needed.  Follow-Up Instructions: Return if symptoms worsen or fail to improve.   Orders:  Orders Placed This Encounter  Procedures  . XR HIP UNILAT W OR W/O PELVIS 2-3 VIEWS LEFT   Meds ordered this encounter  Medications  . amoxicillin (AMOXIL) 500 MG capsule    Sig: Take 4 capsules (2,000 mg total) by mouth once for 1 dose.    Dispense:  4 capsule    Refill:  6  . clobetasol ointment (TEMOVATE) 0.05 %    Sig: Apply 1 application topically 2 (two) times daily.    Dispense:  60 g    Refill:  2      Procedures: No procedures performed   Clinical Data: No additional findings.   Subjective: Chief Complaint  Patient presents with  . Left Hip - Pain    Stephanie Coleman is a 2-year status post left total hip replacement for femoral neck fracture.  She is doing well has no complaints.  She is able to do any activity she wants to do.   Review of Systems   Objective: Vital Signs: There were no vitals taken for this visit.  Physical Exam  Ortho Exam Left hip shows fully healed surgical scar.  Great range of motion without pain. Specialty Comments:  No specialty comments available.  Imaging: XR HIP UNILAT W OR W/O PELVIS 2-3 VIEWS LEFT  Result Date: 02/29/2020 Stable total hip replacement without complication    PMFS History: Patient Active Problem List   Diagnosis Date Noted  . Status post total hip replacement, left 04/18/2018  . Closed  displaced fracture of left femoral neck ([place]) 03/05/2018  . Diet-controlled diabetes mellitus ([place]) 03/05/2018  . Intra-abdominal abscess ([place]) 01/30/2012  . Intra-abdominal fluid collection 01/29/2012   Past Medical History:  Diagnosis Date  . DDD (degenerative disc disease), lumbar   . Depression   . Diabetes mellitus without complication (HCC)    diet controlled  . Hyperlipidemia    pt states borderline  . Obesity     History reviewed. No pertinent family history.  Past Surgical History:  Procedure Laterality Date  . BACK SURGERY    . CHOLECYSTECTOMY    . colorectal surgery    . TOTAL HIP ARTHROPLASTY Left 03/06/2018   Procedure: TOTAL HIP ARTHROPLASTY ANTERIOR APPROACH;  Surgeon: Leandrew Koyanagi, MD;  Location: [place];  Service: Orthopedics;  Laterality: Left;   Social History   Occupational History  . Not on file  Tobacco Use  . Smoking status: Never Smoker  . Smokeless tobacco: Never Used  Substance and Sexual Activity  . Alcohol use: No  . Drug use: No  . Sexual activity: Never

## 2020-06-14 DIAGNOSIS — E119 Type 2 diabetes mellitus without complications: Secondary | ICD-10-CM | POA: Diagnosis not present

## 2020-07-29 DIAGNOSIS — H25013 Cortical age-related cataract, bilateral: Secondary | ICD-10-CM | POA: Diagnosis not present

## 2020-07-29 DIAGNOSIS — H25043 Posterior subcapsular polar age-related cataract, bilateral: Secondary | ICD-10-CM | POA: Diagnosis not present

## 2020-07-29 DIAGNOSIS — H18413 Arcus senilis, bilateral: Secondary | ICD-10-CM | POA: Diagnosis not present

## 2020-07-29 DIAGNOSIS — H2513 Age-related nuclear cataract, bilateral: Secondary | ICD-10-CM | POA: Diagnosis not present

## 2020-07-29 DIAGNOSIS — H2511 Age-related nuclear cataract, right eye: Secondary | ICD-10-CM | POA: Diagnosis not present

## 2020-10-13 DIAGNOSIS — H2513 Age-related nuclear cataract, bilateral: Secondary | ICD-10-CM | POA: Diagnosis not present

## 2020-10-13 DIAGNOSIS — H2511 Age-related nuclear cataract, right eye: Secondary | ICD-10-CM | POA: Diagnosis not present

## 2020-10-14 DIAGNOSIS — H2512 Age-related nuclear cataract, left eye: Secondary | ICD-10-CM | POA: Diagnosis not present

## 2020-10-27 DIAGNOSIS — H2512 Age-related nuclear cataract, left eye: Secondary | ICD-10-CM | POA: Diagnosis not present

## 2020-10-27 DIAGNOSIS — H2513 Age-related nuclear cataract, bilateral: Secondary | ICD-10-CM | POA: Diagnosis not present

## 2024-02-16 ENCOUNTER — Other Ambulatory Visit: Payer: Self-pay | Admitting: Medical Genetics

## 2024-02-16 DIAGNOSIS — Z006 Encounter for examination for normal comparison and control in clinical research program: Secondary | ICD-10-CM
